# Patient Record
Sex: Male | Born: 1965 | Race: White | Hispanic: No | Marital: Married | Smoking: Never smoker
Health system: Southern US, Community
[De-identification: ages and names within clinical notes are randomized; demographics above are authoritative.]

## PROBLEM LIST (undated history)

## (undated) DIAGNOSIS — L570 Actinic keratosis: Secondary | ICD-10-CM

## (undated) DIAGNOSIS — M8580 Other specified disorders of bone density and structure, unspecified site: Secondary | ICD-10-CM

## (undated) DIAGNOSIS — E119 Type 2 diabetes mellitus without complications: Secondary | ICD-10-CM

## (undated) DIAGNOSIS — I10 Essential (primary) hypertension: Secondary | ICD-10-CM

## (undated) DIAGNOSIS — E785 Hyperlipidemia, unspecified: Secondary | ICD-10-CM

## (undated) HISTORY — DX: Type 2 diabetes mellitus without complications: E11.9

## (undated) HISTORY — DX: Other specified disorders of bone density and structure, unspecified site: M85.80

## (undated) HISTORY — DX: Hyperlipidemia, unspecified: E78.5

## (undated) HISTORY — PX: TONSILECTOMY, ADENOIDECTOMY, BILATERAL MYRINGOTOMY AND TUBES: SHX2538

## (undated) HISTORY — DX: Essential (primary) hypertension: I10

## (undated) HISTORY — DX: Actinic keratosis: L57.0

---

## 2003-07-30 DIAGNOSIS — C4491 Basal cell carcinoma of skin, unspecified: Secondary | ICD-10-CM

## 2003-07-30 HISTORY — DX: Basal cell carcinoma of skin, unspecified: C44.91

## 2009-01-08 ENCOUNTER — Ambulatory Visit: Payer: Self-pay | Admitting: Unknown Physician Specialty

## 2010-01-14 ENCOUNTER — Emergency Department: Payer: Self-pay | Admitting: Emergency Medicine

## 2010-01-28 ENCOUNTER — Ambulatory Visit: Payer: Self-pay | Admitting: Unknown Physician Specialty

## 2010-02-05 ENCOUNTER — Ambulatory Visit: Payer: Self-pay | Admitting: Unknown Physician Specialty

## 2010-03-25 ENCOUNTER — Ambulatory Visit: Payer: Self-pay

## 2010-04-06 ENCOUNTER — Ambulatory Visit: Payer: Self-pay

## 2013-11-01 ENCOUNTER — Ambulatory Visit: Payer: Self-pay | Admitting: Family Medicine

## 2013-11-22 ENCOUNTER — Ambulatory Visit: Payer: Self-pay | Admitting: Family Medicine

## 2014-05-06 DIAGNOSIS — E119 Type 2 diabetes mellitus without complications: Secondary | ICD-10-CM | POA: Insufficient documentation

## 2014-05-06 DIAGNOSIS — G4733 Obstructive sleep apnea (adult) (pediatric): Secondary | ICD-10-CM | POA: Insufficient documentation

## 2014-05-06 DIAGNOSIS — Z9989 Dependence on other enabling machines and devices: Secondary | ICD-10-CM

## 2015-05-07 DIAGNOSIS — K429 Umbilical hernia without obstruction or gangrene: Secondary | ICD-10-CM | POA: Insufficient documentation

## 2015-05-21 ENCOUNTER — Other Ambulatory Visit: Payer: Self-pay | Admitting: Gastroenterology

## 2015-05-21 DIAGNOSIS — R7989 Other specified abnormal findings of blood chemistry: Secondary | ICD-10-CM

## 2015-05-21 DIAGNOSIS — R945 Abnormal results of liver function studies: Principal | ICD-10-CM

## 2015-06-10 ENCOUNTER — Ambulatory Visit
Admission: RE | Admit: 2015-06-10 | Discharge: 2015-06-10 | Disposition: A | Discharge status: Home / Self Care | Payer: BLUE CROSS/BLUE SHIELD | Source: Ambulatory Visit | Attending: Gastroenterology | Admitting: Gastroenterology

## 2015-06-10 DIAGNOSIS — R7989 Other specified abnormal findings of blood chemistry: Secondary | ICD-10-CM | POA: Diagnosis not present

## 2015-06-10 DIAGNOSIS — R945 Abnormal results of liver function studies: Secondary | ICD-10-CM

## 2015-06-10 DIAGNOSIS — K760 Fatty (change of) liver, not elsewhere classified: Secondary | ICD-10-CM | POA: Diagnosis not present

## 2015-06-10 DIAGNOSIS — I77811 Abdominal aortic ectasia: Secondary | ICD-10-CM | POA: Insufficient documentation

## 2015-08-27 DIAGNOSIS — E538 Deficiency of other specified B group vitamins: Secondary | ICD-10-CM | POA: Insufficient documentation

## 2016-11-22 ENCOUNTER — Encounter: Payer: Self-pay | Admitting: Sports Medicine

## 2016-11-22 ENCOUNTER — Ambulatory Visit (INDEPENDENT_AMBULATORY_CARE_PROVIDER_SITE_OTHER): Payer: BLUE CROSS/BLUE SHIELD | Admitting: Sports Medicine

## 2016-11-22 ENCOUNTER — Ambulatory Visit: Payer: Self-pay

## 2016-11-22 ENCOUNTER — Ambulatory Visit (INDEPENDENT_AMBULATORY_CARE_PROVIDER_SITE_OTHER): Payer: BLUE CROSS/BLUE SHIELD

## 2016-11-22 VITALS — BP 120/84 | HR 74 | Ht 72.0 in | Wt 230.0 lb

## 2016-11-22 DIAGNOSIS — F411 Generalized anxiety disorder: Secondary | ICD-10-CM | POA: Insufficient documentation

## 2016-11-22 DIAGNOSIS — S52531A Colles' fracture of right radius, initial encounter for closed fracture: Secondary | ICD-10-CM

## 2016-11-22 DIAGNOSIS — E785 Hyperlipidemia, unspecified: Secondary | ICD-10-CM | POA: Insufficient documentation

## 2016-11-22 DIAGNOSIS — M25531 Pain in right wrist: Secondary | ICD-10-CM

## 2016-11-22 DIAGNOSIS — K219 Gastro-esophageal reflux disease without esophagitis: Secondary | ICD-10-CM | POA: Insufficient documentation

## 2016-11-22 DIAGNOSIS — M8580 Other specified disorders of bone density and structure, unspecified site: Secondary | ICD-10-CM

## 2016-11-22 DIAGNOSIS — S52501A Unspecified fracture of the lower end of right radius, initial encounter for closed fracture: Secondary | ICD-10-CM | POA: Insufficient documentation

## 2016-11-22 DIAGNOSIS — I10 Essential (primary) hypertension: Secondary | ICD-10-CM | POA: Insufficient documentation

## 2016-11-22 DIAGNOSIS — L659 Nonscarring hair loss, unspecified: Secondary | ICD-10-CM | POA: Insufficient documentation

## 2016-11-22 NOTE — Assessment & Plan Note (Signed)
We will need to look into this further at follow-up.  He does have a pathologic fracture may be a candidate for osteoclast inhibitors including Prolia once he is over the acute healing aspect.

## 2016-11-22 NOTE — Progress Notes (Signed)
OFFICE VISIT NOTE Benjamin Little. Benjamin Little, Benjamin Little  Benjamin Little - 51 y.o. male MRN [id number]  Date of birth: Dec 05, 1965  Visit Date: 11/22/2016  PCP: Derinda Late, MD   Referred by: Derinda Late, MD  Burlene Arnt, CMA acting as scribe for Dr. Paulla Fore.  SUBJECTIVE:   Chief Complaint  Patient presents with  . wrist pain s/p fall   HPI: As below and per problem based documentation when appropriate.  Pt presents today with complaint of pain in right wrist.  Pain started Saturday morning after pt fell. He says that when he fell he landed on his palm and bent is wrist back. He has noticed some swelling in the right wrist. Pain is mostly on the radial side of of the wrist. He has noticed decreased flexibility in the wrist and increased tightness. He has pain with flexion, extension, and rotation.   The pain is described as aching soreness and is rated as 5/10.  Worsened with movement Therapies tried include : He wore a carpal tunnel brace yesterday and he felt like his wrist was more stable while wearing the brace but was very sore when he took it off. He has used ice, essential oils and Advil with some relief.   Other associated symptoms include: Pt denies radiating pain into hand, fingers.   Pt denies fever, chills, night sweats.     Review of Systems  Constitutional: Negative for chills and fever.  Respiratory: Negative for shortness of breath and wheezing.   Cardiovascular: Negative for chest pain, palpitations and leg swelling.  Musculoskeletal: Positive for falls.  Neurological: Negative for dizziness, tingling and headaches.  Endo/Heme/Allergies: Does not bruise/bleed easily.    Otherwise per HPI.  HISTORY & PERTINENT PRIOR DATA:  No specialty comments available. He reports that he has never smoked. He has never used smokeless tobacco. No results for input(s): HGBA1C, LABURIC in the last 8760  hours. Medications & Allergies reviewed per EMR Patient Active Problem List   Diagnosis Date Noted  . Esophageal reflux 11/22/2016  . Anxiety state 11/22/2016  . Alopecia 11/22/2016  . Benign essential hypertension 11/22/2016  . Other and unspecified hyperlipidemia 11/22/2016  . Distal radius fracture, right 11/22/2016  . Osteopenia 11/22/2016  . B12 deficiency 08/27/2015  . Umbilical hernia without obstruction and without gangrene 05/07/2015  . OSA on CPAP 05/06/2014  . Diabetes mellitus without complication ([place]) 36/05/2448   Past Medical History:  Diagnosis Date  . Diabetes mellitus without complication ([place])   . Hyperlipidemia   . Hypertension   . Osteopenia    History reviewed. No pertinent family history. Past Surgical History:  Procedure Laterality Date  . TONSILECTOMY, ADENOIDECTOMY, BILATERAL MYRINGOTOMY AND TUBES     Social History   Occupational History  . Not on file.   Social History Main Topics  . Smoking status: Never Smoker  . Smokeless tobacco: Never Used  . Alcohol use No  . Drug use: Unknown  . Sexual activity: Yes    OBJECTIVE:  VS:  HT:6' (182.9 cm)   WT:230 lb (104.3 kg)  BMI:31.3    BP:120/84  HR:74bpm  TEMP: ( )  RESP:94 % EXAM: Findings:  WDWN, NAD, Non-toxic appearing Alert & appropriately interactive Not depressed or anxious appearing No increased work of breathing. Pupils are equal. EOM intact without nystagmus No clubbing or cyanosis of the extremities appreciated No significant rashes/lesions/ulcerations overlying the examined area.  Generalized dry skin with  scattered picking lesions. Radial pulses 2+/4.  No significant generalized UE edema. Sensation intact to light touch in upper extremities.  Right wrist: Overall well aligned.  No visible deformity but some generalized swelling.  No significant ecchymosis.  He does have a small palpable defect in the distal radius and this is focally tender.  No significant wrist  effusion.  Pain with wrist extension, radial deviation and ulnar deviation.  X-rays reviewed show a nondisplaced distal radius fracture consistent with a nondisplaced Colles' fracture with evidence of cortical disruption on the palmar side.  ++++++++++++++++++++++++++++++++++++++++++++++++++++++++++++++++++ LIMITED MSK ULTRASOUND OF right wrist Images were obtained and interpreted by myself, Teresa Coombs, DO  Images have been saved and stored to PACS system. Images obtained on: GE S7 Ultrasound machine  FINDINGS:  Small cortical irregularity within the palmar aspect of the distal radius with large hematoma overall good alignment.  IMPRESSION:  Nondisplaced distal radius fracture     No results found. ASSESSMENT & PLAN:   Problem List Items Addressed This Visit    Distal radius fracture, right    Nondisplaced distal radius fracture with evidence on ultrasound. Overall well aligned.  No significant deformity.  We will place him in a wrist immobilizer and plan follow-up with him in 2 weeks for repeat ultrasound at that time.  Continue with ice elevation and OTC NSAIDs and avoidance of exacerbating activities.      Osteopenia    We will need to look into this further at follow-up.  He does have a pathologic fracture may be a candidate for osteoclast inhibitors including Prolia once he is over the acute healing aspect.       Other Visit Diagnoses    Right wrist pain    -  Primary   Relevant Orders   DG Wrist Complete Right   [place] LIMITED JOINT SPACE STRUCTURES UP RIGHT(NO LINKED CHARGES)      Follow-up: Return in about 2 weeks (around 12/06/2016) for repeat diagnostic ultrasound.   CMA/ATC served as Education administrator during this visit. History, Physical, and Plan performed by medical provider. Documentation and orders reviewed and attested to.      Teresa Coombs, [place] Sports [facility] Physician

## 2016-11-22 NOTE — Assessment & Plan Note (Signed)
Nondisplaced distal radius fracture with evidence on ultrasound. Overall well aligned.  No significant deformity.  We will place him in a wrist immobilizer and plan follow-up with him in 2 weeks for repeat ultrasound at that time.  Continue with ice elevation and OTC NSAIDs and avoidance of exacerbating activities.

## 2016-12-06 ENCOUNTER — Ambulatory Visit: Payer: Self-pay

## 2016-12-06 ENCOUNTER — Encounter: Payer: Self-pay | Admitting: Sports Medicine

## 2016-12-06 ENCOUNTER — Ambulatory Visit (INDEPENDENT_AMBULATORY_CARE_PROVIDER_SITE_OTHER): Payer: BLUE CROSS/BLUE SHIELD | Admitting: Sports Medicine

## 2016-12-06 VITALS — BP 140/100 | HR 73 | Ht 72.0 in | Wt 230.0 lb

## 2016-12-06 DIAGNOSIS — M8580 Other specified disorders of bone density and structure, unspecified site: Secondary | ICD-10-CM

## 2016-12-06 DIAGNOSIS — S52531A Colles' fracture of right radius, initial encounter for closed fracture: Secondary | ICD-10-CM

## 2016-12-06 NOTE — Progress Notes (Signed)
OFFICE VISIT NOTE Benjamin Little. Benjamin Little, [facility] [place]  NHAT HEARNE - 51 y.o. male MRN [id number]  Date of birth: 08-06-1965  Visit Date: 12/06/2016  PCP: Derinda Late, MD   Referred by: Derinda Late, MD  Autumn [address], cma acting as scribe for Dr. Paulla Fore.  SUBJECTIVE:   Chief Complaint  Patient presents with  . Follow-up  . Distal Radius Fracture, Right   HPI: As below and per problem based documentation when appropriate.   Benjamin Little reports increased ROM since last visit but not 100 percent better. He is taking Tumeric 300mg  bid. Wearing the wrist brace daily except in the shower. No ice therapy at this time. He is applying essential oils on wrist.     Review of Systems  Constitutional: Negative for chills, diaphoresis, fever, malaise/fatigue and weight loss.  HENT: Negative.   Eyes: Negative.   Respiratory: Negative.   Cardiovascular: Negative.   Gastrointestinal: Negative.   Genitourinary: Negative.   Musculoskeletal: Positive for joint pain and myalgias.  Skin: Positive for rash. Negative for itching.  Neurological: Negative.  Negative for weakness.  Endo/Heme/Allergies: Negative for environmental allergies and polydipsia. Does not bruise/bleed easily.  Psychiatric/Behavioral: Negative.     Otherwise per HPI.  HISTORY & PERTINENT PRIOR DATA:  No specialty comments available. He reports that he has never smoked. He has never used smokeless tobacco. No results for input(s): HGBA1C, LABURIC in the last 8760 hours. Medications & Allergies reviewed per EMR Patient Active Problem List   Diagnosis Date Noted  . Esophageal reflux 11/22/2016  . Anxiety state 11/22/2016  . Alopecia 11/22/2016  . Benign essential hypertension 11/22/2016  . Other and unspecified hyperlipidemia 11/22/2016  . Distal radius fracture, right 11/22/2016  . Osteopenia 11/22/2016  . B12 deficiency 08/27/2015  . Umbilical hernia  without obstruction and without gangrene 05/07/2015  . OSA on CPAP 05/06/2014  . Diabetes mellitus without complication ([place]) 20/94/7096   Past Medical History:  Diagnosis Date  . Diabetes mellitus without complication ([place])   . Hyperlipidemia   . Hypertension   . Osteopenia    History reviewed. No pertinent family history. Past Surgical History:  Procedure Laterality Date  . TONSILECTOMY, ADENOIDECTOMY, BILATERAL MYRINGOTOMY AND TUBES     Social History   Occupational History  . Not on file.   Social History Main Topics  . Smoking status: Never Smoker  . Smokeless tobacco: Never Used  . Alcohol use No  . Drug use: Unknown  . Sexual activity: Yes    OBJECTIVE:  VS:  HT:6' (182.9 cm)   WT:230 lb (104.3 kg)  BMI:31.3    BP:(!) 140/100  HR:73bpm  TEMP: ( )  RESP:98 % EXAM: Findings:  WDWN, NAD, Non-toxic appearing Alert & appropriately interactive Not depressed or anxious appearing No increased work of breathing. Pupils are equal. EOM intact without nystagmus No clubbing or cyanosis of the extremities appreciated No significant rashes/lesions/ulcerations overlying the examined area. Radial pulses 2+/4.  No significant generalized UE edema. Sensation intact to light touch in upper extremities.  Right Wrist: Overall well aligned.  He is a small amount of hypersensitivity reaction of the skin with overall no significant swelling or bruising.  He does have focal TTP over the DRUJ.  Limited wrist extension.  Grip strength is intact.    ++++++++++++++++++++++++++++++++++++++++++++++++++++++++++++++++++ LIMITED MSK ULTRASOUND OF distal radius Images were obtained and interpreted by myself, Teresa Coombs, DO  Images have been saved and stored to  PACS system. Images obtained on: GE S7 Ultrasound machine  FINDINGS:  Comminuted distal radius fracture with bicortical involvement but no significant displacement.  Hematoma has improved and there is early callus formation  however no solid formation at this time.  IMPRESSION:  Healing distal radius fracture comminuted with no displacement      Dg Wrist Complete Right  Result Date: 11/22/2016 CLINICAL DATA:  Status post fall, with right wrist pain. Initial encounter. EXAM: RIGHT WRIST - COMPLETE 3+ VIEW COMPARISON:  None. FINDINGS: There is suggestion of a nondisplaced mildly comminuted fracture through the distal radius, difficult to fully characterize, with likely intra-articular extension. The carpal rows are intact, and demonstrate normal alignment. The joint spaces are preserved. No significant soft tissue abnormalities are seen. IMPRESSION: Suspect nondisplaced mildly comminuted fracture through the distal radius, with likely intra-articular extension. Electronically Signed   By: Garald Balding M.D.   On: 11/22/2016 17:12   ASSESSMENT & PLAN:   Problem List Items Addressed This Visit    Distal radius fracture, right - Primary    This is stable and healing. Continue with wrist immobilizer but okay to work on gentle range of motion. No activity out of the wrist brace and avoid any significant use of the right wrist.  Patient is using essential oils and we discussed that the merits of this are not supported by evidence but are not entirely contraindicated either.  4 weeks for repeat ultrasound at that time      Relevant Orders   [place] LIMITED JOINT SPACE STRUCTURES UP RIGHT(NO LINKED CHARGES)   Osteopenia      Follow-up: Return in about 4 weeks (around 01/03/2017).   CMA/ATC served as Education administrator during this visit. History, Physical, and Plan performed by medical provider. Documentation and orders reviewed and attested to.      Teresa Coombs, [place] [facility] Physician

## 2016-12-06 NOTE — Assessment & Plan Note (Signed)
This is stable and healing. Continue with wrist immobilizer but okay to work on gentle range of motion. No activity out of the wrist brace and avoid any significant use of the right wrist.  Patient is using essential oils and we discussed that the merits of this are not supported by evidence but are not entirely contraindicated either.  4 weeks for repeat ultrasound at that time

## 2017-01-09 ENCOUNTER — Ambulatory Visit: Payer: Self-pay

## 2017-01-09 ENCOUNTER — Ambulatory Visit (INDEPENDENT_AMBULATORY_CARE_PROVIDER_SITE_OTHER): Payer: BLUE CROSS/BLUE SHIELD | Admitting: Sports Medicine

## 2017-01-09 ENCOUNTER — Encounter: Payer: Self-pay | Admitting: Sports Medicine

## 2017-01-09 DIAGNOSIS — S52531A Colles' fracture of right radius, initial encounter for closed fracture: Secondary | ICD-10-CM

## 2017-01-09 DIAGNOSIS — M25511 Pain in right shoulder: Secondary | ICD-10-CM | POA: Diagnosis not present

## 2017-01-09 DIAGNOSIS — G8929 Other chronic pain: Secondary | ICD-10-CM | POA: Diagnosis not present

## 2017-01-09 NOTE — Progress Notes (Signed)
OFFICE VISIT NOTE Benjamin Little, [place] [facility] [place]  Benjamin Little MRN [id number]  Date of birth: Jan 12, 1966  Visit Date: 01/09/2017  PCP: Benjamin Late, MD   Referred by: Benjamin Late, MD  Autumn [address], cma  acting as scribe for Dr. Paulla Fore.  SUBJECTIVE:   Chief Complaint  Patient presents with  . Closed Fracture Colles' Fracture of RT Radius   HPI: As below and per problem based documentation when appropriate.   Benjamin Little reports improvement in his Right wrist pain. He is wearing brace at bedtime only or when he is physically active. He has not played golf since pain started. Intermittently using essential oils and working on ROM exercises. No pain at rest but doesn't quiet feel its 100%.    Review of Systems  Constitutional: Negative for chills, diaphoresis, fever, malaise/fatigue and weight loss.  HENT: Negative.   Eyes: Negative.   Respiratory: Negative.   Cardiovascular: Negative.   Gastrointestinal: Negative.   Genitourinary: Negative.   Musculoskeletal: Positive for joint pain and myalgias. Negative for back pain, falls and neck pain.  Skin: Negative.   Neurological: Negative.  Negative for weakness.  Endo/Heme/Allergies: Negative for environmental allergies and polydipsia. Does not bruise/bleed easily.  Psychiatric/Behavioral: Negative.     Otherwise per HPI.  HISTORY & PERTINENT PRIOR DATA:  No specialty comments available. He reports that he has never smoked. He has never used smokeless tobacco. No results for input(s): HGBA1C, LABURIC in the last 8760 hours. Medications & Allergies reviewed per EMR Patient Active Problem List   Diagnosis Date Noted  . Esophageal reflux 11/22/2016  . Anxiety state 11/22/2016  . Alopecia 11/22/2016  . Benign essential hypertension 11/22/2016  . Other and unspecified hyperlipidemia 11/22/2016  . Distal radius fracture, right 11/22/2016  .  Osteopenia 11/22/2016  . B12 deficiency 08/27/2015  . Umbilical hernia without obstruction and without gangrene 05/07/2015  . OSA on CPAP 05/06/2014  . Diabetes mellitus without complication ([place]) 28/31/5176   Past Medical History:  Diagnosis Date  . Diabetes mellitus without complication ([place])   . Hyperlipidemia   . Hypertension   . Osteopenia    History reviewed. No pertinent family history. Past Surgical History:  Procedure Laterality Date  . TONSILECTOMY, ADENOIDECTOMY, BILATERAL MYRINGOTOMY AND TUBES     Social History   Occupational History  . Not on file.   Social History Main Topics  . Smoking status: Never Smoker  . Smokeless tobacco: Never Used  . Alcohol use No  . Drug use: Unknown  . Sexual activity: Yes    OBJECTIVE:  VS:  HT:6' (182.9 cm)   WT:230 lb 6.4 oz (104.5 kg)  BMI:31.3    BP:(!) 140/100  HR:73bpm  TEMP: ( )  RESP:98 % EXAM: Findings:  Laboratory overall well aligned.  His skin is intact with only small picking lesions that are chronic for him.  The wrist has only minimal swelling.  He is mildly limited ulnar deviation and radial deviation.  Good grip strength.  Mild pain with wrist extension but this is minimal.  No significant pain with palpation over the DRUJ.  Radial pulses and capillary refill are normal.     [place] Limited Joint Space Structures Up Right(no Linked Charges)  Result Date: 01/09/2017 Gerda Diss, DO     01/11/2017 12:52 AM LIMITED MSK ULTRASOUND OF Right Wrist Images were obtained and interpreted by myself, Teresa Coombs, DO Images have been  saved and stored to PACS system. Images obtained on: GE S7 Ultrasound machine FINDINGS:  Evidence of healing of the distal radius fracture that is mild.  There is no significant articular effusion and there is overall good callus formation. IMPRESSION: 1. Healing distal radius fracture   ASSESSMENT & PLAN:     ICD-10-CM   1. Chronic right shoulder pain M25.511 CANCELED: [place] LIMITED JOINT  SPACE STRUCTURES UP RIGHT(NO LINKED CHARGES)   G89.29   2. Closed Colles' fracture of right radius, initial encounter S52.531A [place] LIMITED JOINT SPACE STRUCTURES UP RIGHT(NO LINKED CHARGES)  ================================================================= Distal radius fracture, right This is healing quite well.  He has only minimal symptoms essentially no pain he has been able to wean out of the brace somewhat earlier than anticipated but the fact he has minimal pain and there is evidence of good callus formation on the ultrasound I am okay with him being out of this on a regular basis.  Slowly increasing activity over the next several weeks will be appropriate and did emphasize the importance of preventing any type of recurrent injury.  If any lack of improvement or persistent symptoms he will plan to follow-up otherwise follow-up as needed. ================================================================= Patient Instructions  You can use the compression sleeve at any time throughout the day but is most important to use while being active as well as for 2 hours post-activity.   It is appropriate to ice following activity with the compression sleeve in place.   ================================================================= No future appointments.  Follow-up: Return if symptoms worsen or fail to improve.   CMA/ATC served as Education administrator during this visit. History, Physical, and Plan performed by medical provider. Documentation and orders reviewed and attested to.      Teresa Coombs, [place] Sports Medicine Physician

## 2017-01-09 NOTE — Patient Instructions (Addendum)
You can use the compression sleeve at any time throughout the day but is most important to use while being active as well as for 2 hours post-activity.   It is appropriate to ice following activity with the compression sleeve in place.   

## 2017-01-11 NOTE — Assessment & Plan Note (Signed)
This is healing quite well.  He has only minimal symptoms essentially no pain he has been able to wean out of the brace somewhat earlier than anticipated but the fact he has minimal pain and there is evidence of good callus formation on the ultrasound I am okay with him being out of this on a regular basis.  Slowly increasing activity over the next several weeks will be appropriate and did emphasize the importance of preventing any type of recurrent injury.  If any lack of improvement or persistent symptoms he will plan to follow-up otherwise follow-up as needed.

## 2017-01-11 NOTE — Procedures (Signed)
LIMITED MSK ULTRASOUND OF Right Wrist Images were obtained and interpreted by myself, Teresa Coombs, DO  Images have been saved and stored to PACS system. Images obtained on: GE S7 Ultrasound machine  FINDINGS:   Evidence of healing of the distal radius fracture that is mild.  There is no significant articular effusion and there is overall good callus formation.  IMPRESSION:  1. Healing distal radius fracture

## 2017-03-10 ENCOUNTER — Telehealth: Payer: Self-pay | Admitting: Sports Medicine

## 2017-03-10 NOTE — Telephone Encounter (Signed)
Patient called in reference to receipt issue for [employer]. Patient stated the receipt does not match for [employer] pay account. Patient stated the amount was $36.46 but receipt  needed is for $36.47. Please call patient and advise. OK to leave message.

## 2017-03-10 NOTE — Telephone Encounter (Signed)
Spoke with patient and provided phone number for [employer] rep Clarise Cruz. Pt will contact her regarding billing issue.

## 2017-04-13 DIAGNOSIS — C4492 Squamous cell carcinoma of skin, unspecified: Secondary | ICD-10-CM

## 2017-04-13 HISTORY — DX: Squamous cell carcinoma of skin, unspecified: C44.92

## 2017-12-08 ENCOUNTER — Encounter: Payer: Self-pay | Admitting: Cardiovascular Disease

## 2017-12-08 ENCOUNTER — Encounter

## 2017-12-08 ENCOUNTER — Ambulatory Visit: Payer: BLUE CROSS/BLUE SHIELD | Admitting: Cardiovascular Disease

## 2017-12-08 VITALS — BP 148/84 | HR 73 | Ht 72.0 in | Wt 232.0 lb

## 2017-12-08 DIAGNOSIS — I10 Essential (primary) hypertension: Secondary | ICD-10-CM

## 2017-12-08 DIAGNOSIS — R0789 Other chest pain: Secondary | ICD-10-CM

## 2017-12-08 DIAGNOSIS — E785 Hyperlipidemia, unspecified: Secondary | ICD-10-CM

## 2017-12-08 MED ORDER — METOPROLOL TARTRATE 50 MG PO TABS: 50.0000 mg | ORAL_TABLET | ORAL | 0 refills | Status: DC

## 2017-12-08 NOTE — Patient Instructions (Addendum)
Please arrive at the [facility] main entrance of [facility] at xx:xx AM (30-45 minutes prior to test start time)  [facility] [place],  [address] [phone]  Proceed to the [facility] Radiology Department (First Floor).  Please follow these instructions carefully (unless otherwise directed):  Hold all erectile dysfunction medications at least 48 hours prior to test.  On the Night Before the Test: . Drink plenty of water. . Do not consume any caffeinated/decaffeinated beverages or chocolate 12 hours prior to your test. . Do not take any antihistamines 12 hours prior to your test. . If you take Metformin do not take 24 hours prior to test.  On the Day of the Test: . Drink plenty of water. Do not drink any water within one hour of the test. . Do not eat any food 4 hours prior to the test. . You may take your regular medications prior to the test. . IF NOT ON A BETA BLOCKER - Take 50 mg of lopressor (metoprolol) one hour before the test. . HOLD Furosemide morning of the test.  After the Test: . Drink plenty of water. . After receiving IV contrast, you may experience a mild flushed feeling. This is normal. . On occasion, you may experience a mild rash up to 24 hours after the test. This is not dangerous. If this occurs, you can take Benadryl 25 mg and increase your fluid intake. . If you experience trouble breathing, this can be serious. If it is severe call 911 IMMEDIATELY. If it is mild, please call our office. . If you take any of these medications: Glipizide/Metformin, Avandament, Glucavance, please do not take 48 hours after completing test.   Your physician recommends that you schedule a follow-up appointment in: 3 months with Dr. Fletcher Anon.   It was a pleasure seeing you today here in the office. Please do not hesitate to give [place] a call back if you have any further questions. [place], BSN

## 2017-12-08 NOTE — Progress Notes (Signed)
Cardiology Office Note   Date:  12/08/2017   ID:  Benjamin Little, DOB [birth year], MRN [id number]  PCP:  Derinda Late, MD  Cardiologist:   Kathlyn Sacramento, MD   Chief Complaint  Patient presents with  . New Patient (Initial Visit)    Self referral for Chest Pressure. Meds reviewed verbally with patient.       History of Present Illness: Benjamin Little is a 52 y.o. male who is self-referred for evaluation of chest pain.  He has no prior cardiac history and no previous cardiac evaluation.  He has multiple chronic medical problems that include type 2 diabetes, hypertension, hyperlipidemia, sleep apnea on CPAP, previous pancreatitis, GERD and obesity.  He is a lifelong non-smoker.  Family history is remarkable for coronary artery disease.  His father had CABG in his 30s.  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 Over the last month, he has experienced intermittent episodes of substernal chest tightness mostly at rest but occasionally with activities.  This has been associated with shortness of breath.  He does not exercise on a regular basis.  He also had increased heartburn.  He was prescribed Zantac in addition to his PPI and has noticed some improvement but not complete resolution. No orthopnea, PND or leg edema.    Past Medical History:  Diagnosis Date  . Diabetes mellitus without complication ([place])   . Hyperlipidemia   . Hypertension   . Osteopenia     Past Surgical History:  Procedure Laterality Date  . TONSILECTOMY, ADENOIDECTOMY, BILATERAL MYRINGOTOMY AND TUBES       Current Outpatient Medications  Medication Sig Dispense Refill  . dexlansoprazole (DEXILANT) 60 MG capsule TAKE ONE CAPSULE BY MOUTH TWICE A DAY    . glipiZIDE (GLUCOTROL XL) 2.5 MG 24 hr tablet     . lisinopril-hydrochlorothiazide (PRINZIDE,ZESTORETIC) 20-12.5 MG tablet Take 1 tablet by mouth daily.   3  . metFORMIN (GLUCOPHAGE) 500 MG tablet TAKE 2 TABLETS (1,000 MG TOTAL) BY MOUTH 2 (TWO) TIMES DAILY  WITH MEALS.  3  . ONETOUCH DELICA LANCETS 47W MISC     . ONETOUCH VERIO test strip     . simvastatin (ZOCOR) 20 MG tablet TAKE 1 TABLET (20 MG TOTAL) BY MOUTH NIGHTLY.  3  . TURMERIC PO Take 300 mg by mouth.      No current facility-administered medications for this visit.     Allergies:   Patient has no known allergies.    Social History:  The patient  reports that he has never smoked. He has never used smokeless tobacco. He reports that he does not drink alcohol.   Family History:  The patient's family history is remarkable for coronary artery disease.  His father had CABG in his 26s as outlined above.   ROS:  Please see the history of present illness.   Otherwise, review of systems are positive for none.   All other systems are reviewed and negative.    PHYSICAL EXAM: VS:  BP (!) 148/84 (BP Location: Right Arm, Patient Position: Sitting, Cuff Size: Normal)   Pulse 73   Ht 6' (1.829 m)   Wt 232 lb (105.2 kg)   BMI 31.46 kg/m  , BMI Body mass index is 31.46 kg/m. GEN: Well nourished, well developed, in no acute distress  HEENT: normal  Neck: no JVD, carotid bruits, or masses Cardiac: RRR; no murmurs, rubs, or gallops,no edema  Respiratory:  clear to auscultation bilaterally, normal work of breathing GI:  soft, nontender, nondistended, + BS MS: no deformity or atrophy  Skin: warm and dry, no rash Neuro:  Strength and sensation are intact Psych: euthymic mood, full affect Radial and distal pulses are normal.  No abdominal bruit.   EKG:  EKG is ordered today. The ekg ordered today demonstrates normal sinus rhythm with no significant ST or T wave changes   Recent Labs: No results found for requested labs within last 8760 hours.    Lipid Panel No results found for: CHOL, TRIG, HDL, CHOLHDL, VLDL, LDLCALC, LDLDIRECT    Wt Readings from Last 3 Encounters:  12/08/17 232 lb (105.2 kg)  01/09/17 230 lb 6.4 oz (104.5 kg)  12/06/16 230 lb (104.3 kg)       PAD Screen  12/08/2017  Previous PAD dx? No  Previous surgical procedure? No  Pain with walking? No  Feet/toe relief with dangling? No  Painful, non-healing ulcers? No  Extremities discolored? No      ASSESSMENT AND PLAN:  1.  Atypical chest pain and exertional dyspnea: His symptoms could be due to GERD.  However, he has multiple risk factors for coronary artery disease including type 2 diabetes.  I recommend evaluation with a CTA of the coronary arteries with FFR.  He is not allergic to contrast.  Further recommendations to follow after this.  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  2.  Essential hypertension: Blood pressure is elevated today but he appears to be very anxious.  Continue to monitor and consider adjusting his blood pressure medications if needed.  3.  Hyperlipidemia: Currently on simvastatin.  I recommend a target LDL of less than 70 given the presence of diabetes.    Disposition:   FU with me in 3 months  Signed,  Kathlyn Sacramento, MD  12/08/2017 9:30 AM    [place]

## 2017-12-22 ENCOUNTER — Telehealth: Payer: Self-pay | Admitting: *Deleted

## 2017-12-22 DIAGNOSIS — I10 Essential (primary) hypertension: Secondary | ICD-10-CM

## 2017-12-22 DIAGNOSIS — Z01818 Encounter for other preprocedural examination: Secondary | ICD-10-CM

## 2017-12-22 NOTE — Telephone Encounter (Signed)
Left a message for the patient to call back. He will need to have a BMET lab drawn prior to his coronary ct scheduled for 01/09/18. Orders have been placed.

## 2017-12-27 NOTE — Telephone Encounter (Signed)
Left a message to call back.

## 2017-12-28 NOTE — Telephone Encounter (Signed)
Call placed to the patient. He stated that he had labs scheduled at his PCP. He will call that office and see if he can get them drawn tomorrow. He will call back with an update. It was stressed to the patient that he needs a BMET prior to his cardiac ct appointment on 01/09/18. Results will be in Care Everywhere.

## 2017-12-28 NOTE — Telephone Encounter (Signed)
Patient returning call.

## 2018-01-03 NOTE — Telephone Encounter (Signed)
Spoke with patient and he reports that they were done during pre-admission and was faxed over to [place] yesterday. Advised that I do not see those at this time but that I would watch for them and be in touch. He was appreciative for the call and follow up. Will call back tomorrow 01/04/18 if not received.

## 2018-01-03 NOTE — Telephone Encounter (Signed)
Patient calling to let us know he had pre procedure labs done at [facility] and these were to be faxed to our office.

## 2018-01-05 NOTE — Telephone Encounter (Signed)
Labs from 12/29/17 are posted in Care Everywhere

## 2018-01-08 ENCOUNTER — Telehealth: Payer: Self-pay | Admitting: Cardiovascular Disease

## 2018-01-08 NOTE — Telephone Encounter (Signed)
Patient calling stating he has a cardiac CT tomorrow He's calling stating on his instructions it says he's not to take Furosemide  He is not taking that he states, wanted to make sure about what he is taking and not to take prior to exam  Would like a call back about this

## 2018-01-08 NOTE — Telephone Encounter (Signed)
S/w patient. Patient is not taking Furosemide. His AVS has the generic pre-procedural instructions for the Cardiac CT. Patient verbalized understanding to not take metformin or glipizide today or for 48 hours after the procedure. He also verbalized understanding of the other pre procedural instructions. He was appreciative.

## 2018-01-09 ENCOUNTER — Ambulatory Visit (HOSPITAL_COMMUNITY): Admission: RE | Admit: 2018-01-09 | Payer: BLUE CROSS/BLUE SHIELD | Source: Ambulatory Visit

## 2018-01-09 ENCOUNTER — Ambulatory Visit (HOSPITAL_COMMUNITY)
Admission: RE | Admit: 2018-01-09 | Discharge: 2018-01-09 | Disposition: A | Discharge status: Home / Self Care | Payer: BLUE CROSS/BLUE SHIELD | Source: Ambulatory Visit | Attending: Cardiovascular Disease | Admitting: Cardiovascular Disease

## 2018-01-09 DIAGNOSIS — R079 Chest pain, unspecified: Secondary | ICD-10-CM | POA: Diagnosis not present

## 2018-01-09 DIAGNOSIS — R0789 Other chest pain: Secondary | ICD-10-CM | POA: Diagnosis not present

## 2018-01-09 MED ORDER — NITROGLYCERIN 0.4 MG SL SUBL
SUBLINGUAL_TABLET | SUBLINGUAL | Status: AC
  Filled 2018-01-09: qty 2

## 2018-01-09 MED ORDER — NITROGLYCERIN 0.4 MG SL SUBL
0.8000 mg | SUBLINGUAL_TABLET | Freq: Once | SUBLINGUAL | Status: AC
  Administered 2018-01-09: 0.8 mg via SUBLINGUAL
  Filled 2018-01-09: qty 25

## 2018-01-09 MED ORDER — IOPAMIDOL (ISOVUE-370) INJECTION 76%
100.0000 mL | Freq: Once | INTRAVENOUS | Status: AC | PRN
  Administered 2018-01-09: 100 mL via INTRAVENOUS

## 2018-01-11 ENCOUNTER — Telehealth: Payer: Self-pay | Admitting: Cardiovascular Disease

## 2018-01-11 NOTE — Telephone Encounter (Signed)
Patient made aware of results and verbalized understanding.  

## 2018-01-11 NOTE — Telephone Encounter (Signed)
Patient returning call for results Please call cell when calling back

## 2018-01-11 NOTE — Telephone Encounter (Signed)
Left a message to call back  Notes recorded by Wellington Hampshire, MD on 01/10/2018 at 12:44 PM EDT Inform patient that CTA showed normal coronary arteries which is great news.

## 2018-02-24 IMAGING — DX DG WRIST COMPLETE 3+V*R*
4 series · 4 of 4 positions shown · non-contrast
Comparison: None.

CLINICAL DATA: Status post fall, with right wrist pain. Initial
encounter.

EXAM:
RIGHT WRIST - COMPLETE 3+ VIEW

[wrist pa]
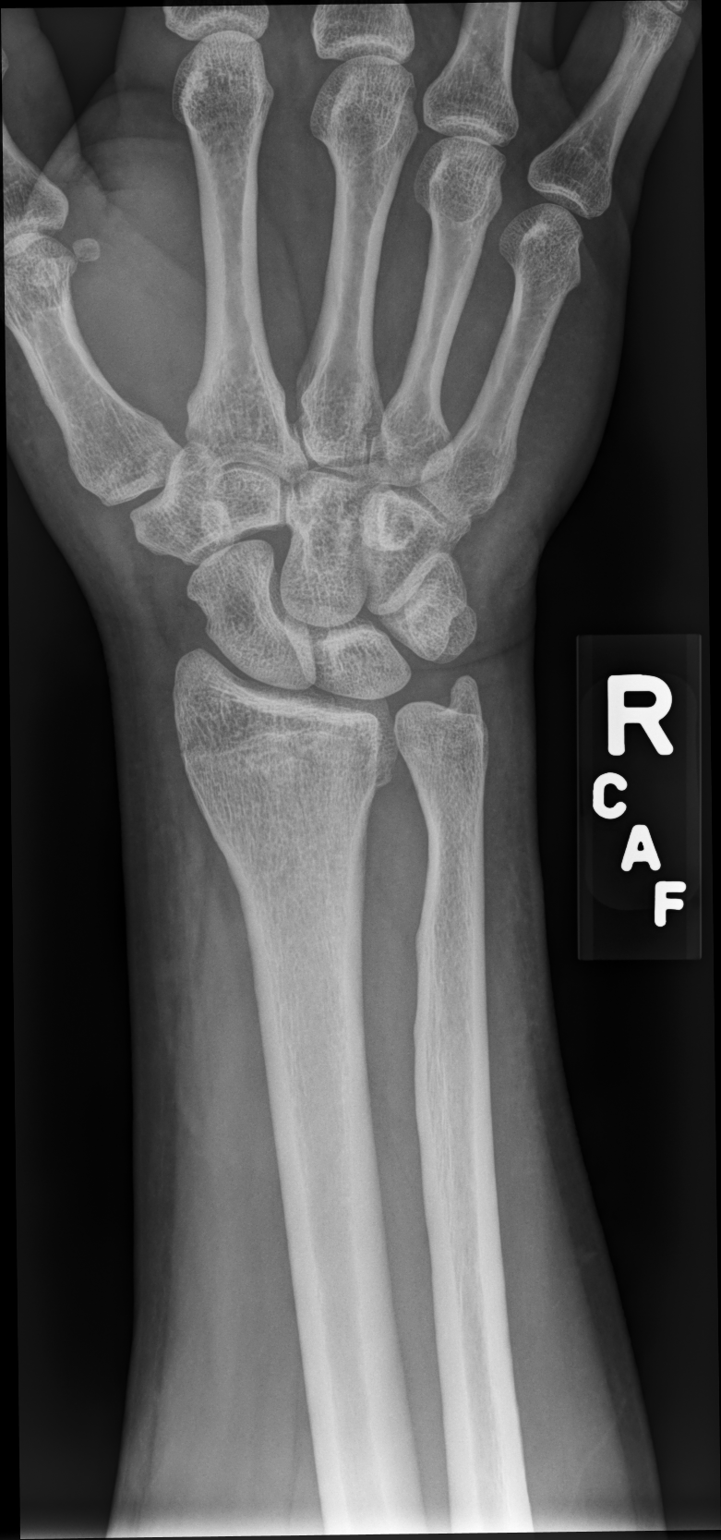

[wrist oblique]
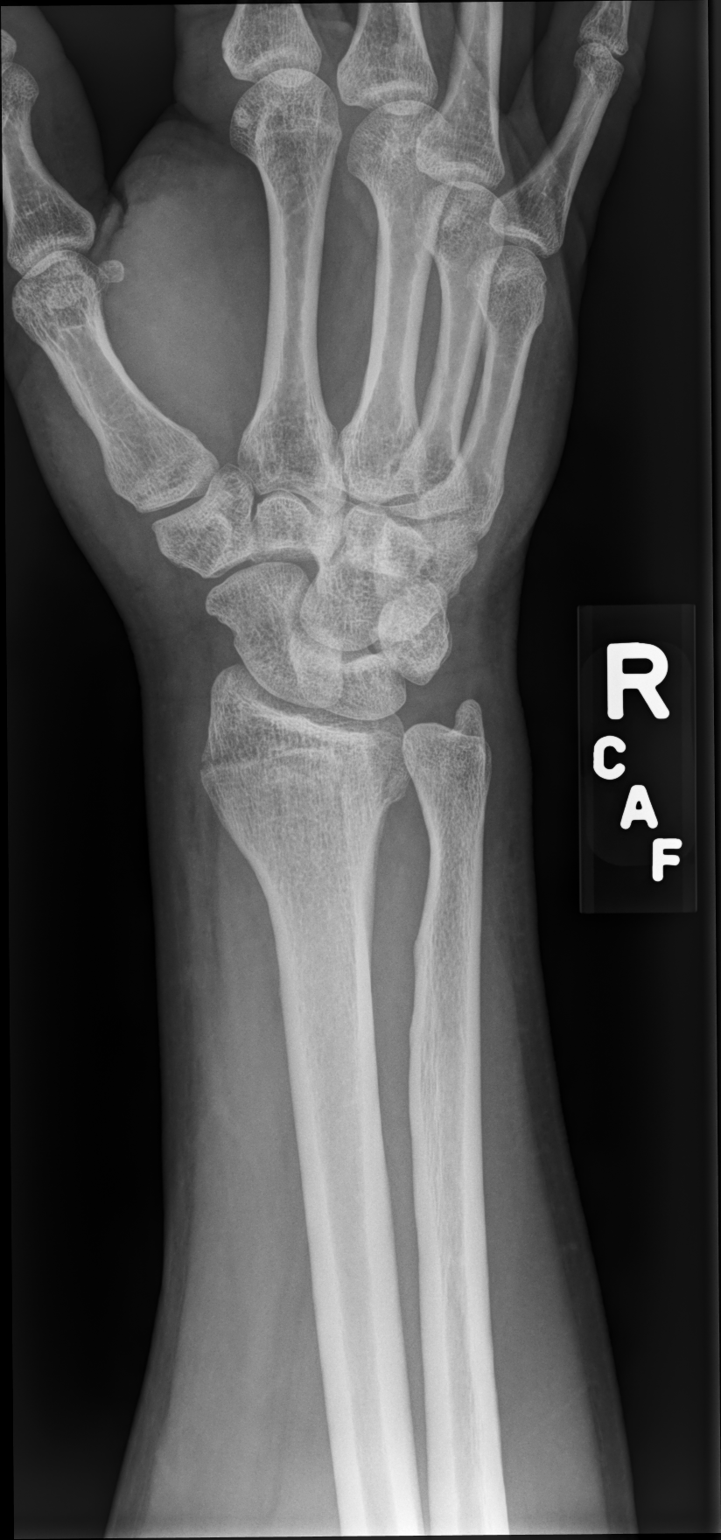

[wrist lat]
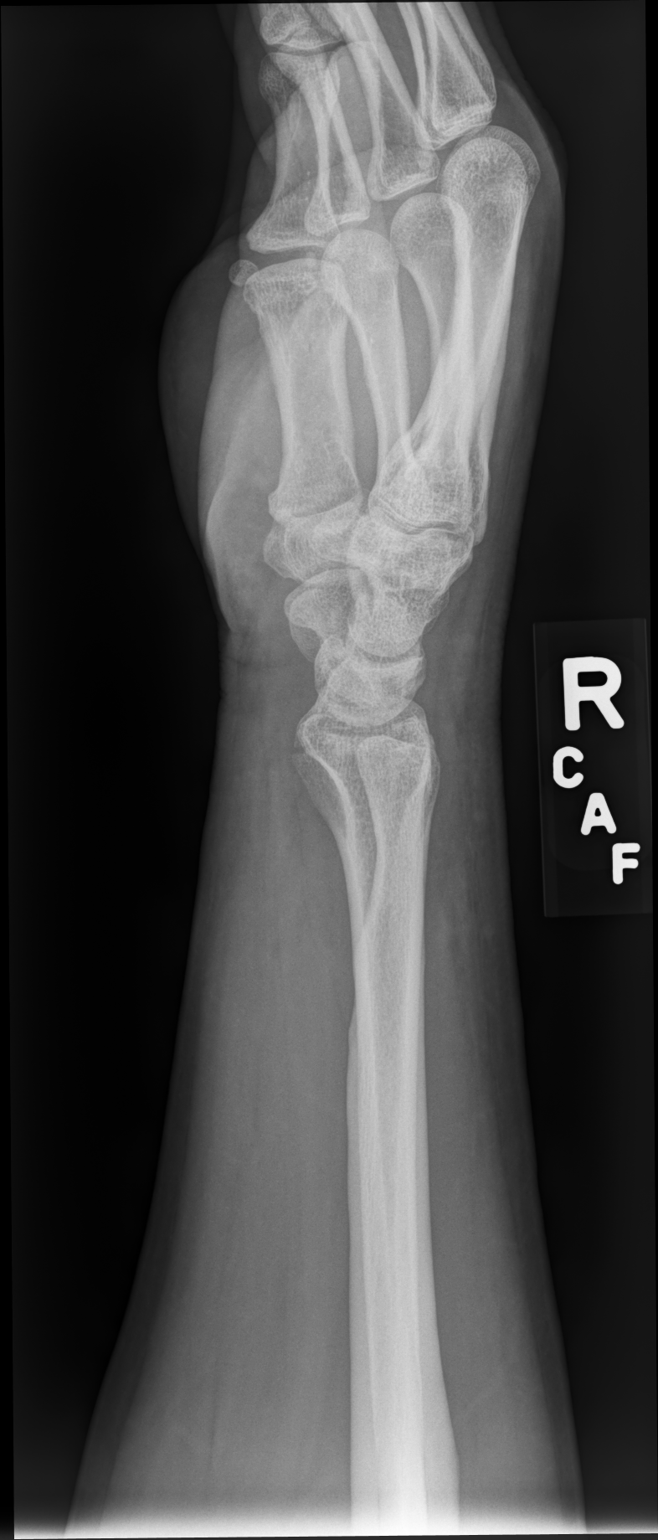

[navicular pa]
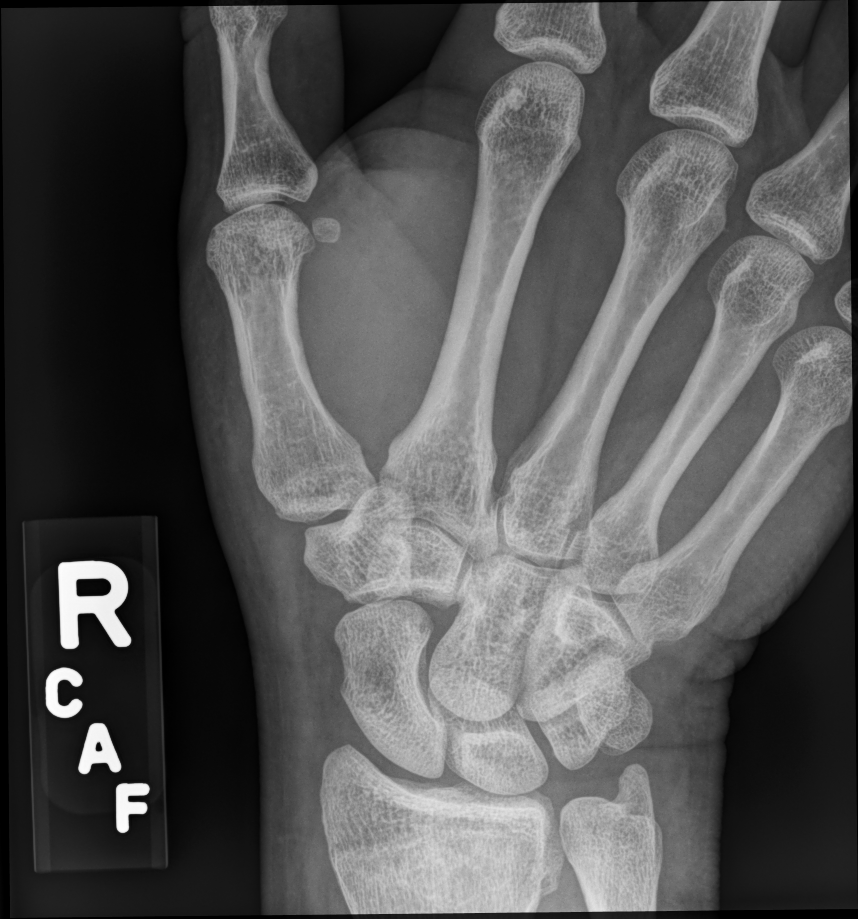

[4 of 4 positions shown; findings below may reference images not displayed]

FINDINGS: There is suggestion of a nondisplaced mildly comminuted fracture
through the distal radius, difficult to fully characterize, with
likely intra-articular extension.

The carpal rows are intact, and demonstrate normal alignment. The
joint spaces are preserved.

No significant soft tissue abnormalities are seen.
IMPRESSION: Suspect nondisplaced mildly comminuted fracture through the distal
radius, with likely intra-articular extension.

## 2018-03-23 ENCOUNTER — Encounter: Payer: Self-pay | Admitting: Cardiovascular Disease

## 2018-03-23 ENCOUNTER — Ambulatory Visit: Payer: BLUE CROSS/BLUE SHIELD | Admitting: Cardiovascular Disease

## 2018-03-23 VITALS — BP 120/80 | HR 66 | Ht 72.0 in | Wt 222.0 lb

## 2018-03-23 DIAGNOSIS — R0789 Other chest pain: Secondary | ICD-10-CM

## 2018-03-23 DIAGNOSIS — I10 Essential (primary) hypertension: Secondary | ICD-10-CM | POA: Diagnosis not present

## 2018-03-23 DIAGNOSIS — I77811 Abdominal aortic ectasia: Secondary | ICD-10-CM

## 2018-03-23 DIAGNOSIS — E785 Hyperlipidemia, unspecified: Secondary | ICD-10-CM | POA: Diagnosis not present

## 2018-03-23 NOTE — Progress Notes (Signed)
Cardiology Office Note   Date:  03/23/2018   ID:  Benjamin Little, DOB 1966/06/02, MRN [id number]  PCP:  Derinda Late, MD  Cardiologist:   Kathlyn Sacramento, MD   Chief Complaint  Patient presents with  . other    3 month follow up. Meds reviewed by the pt. verbally. "doing well."       History of Present Illness: Benjamin Little is a 52 y.o. male who is here today for follow-up visit regarding chest pain.    He has no prior cardiac history and no previous cardiac evaluation.  He has multiple chronic medical problems that include type 2 diabetes, hypertension, hyperlipidemia, sleep apnea on CPAP, previous pancreatitis, GERD and obesity.  He is a lifelong non-smoker.  Family history is remarkable for coronary artery disease.  His father had CABG in his 24s.  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 He was seen recently for atypical chest pain with some of his symptoms were exertional.  I proceeded with CT angiography which showed normal coronary arteries and calcium score of 0.  His symptoms improved with ranitidine.    Past Medical History:  Diagnosis Date  . Diabetes mellitus without complication ([place])   . Hyperlipidemia   . Hypertension   . Osteopenia     Past Surgical History:  Procedure Laterality Date  . TONSILECTOMY, ADENOIDECTOMY, BILATERAL MYRINGOTOMY AND TUBES       Current Outpatient Medications  Medication Sig Dispense Refill  . dexlansoprazole (DEXILANT) 60 MG capsule Take 60 mg by mouth daily.     Marland Kitchen glipiZIDE (GLUCOTROL XL) 2.5 MG 24 hr tablet Take 2.5 mg by mouth daily with breakfast.     . lisinopril-hydrochlorothiazide (PRINZIDE,ZESTORETIC) 20-12.5 MG tablet Take 2 tablets by mouth daily.   3  . metFORMIN (GLUCOPHAGE) 500 MG tablet TAKE 2 TABLETS (1,000 MG TOTAL) BY MOUTH 2 (TWO) TIMES DAILY WITH MEALS.  3  . ONETOUCH DELICA LANCETS 78G MISC     . ONETOUCH VERIO test strip     . ranitidine (ZANTAC) 150 MG capsule Take 150 mg by mouth 2 (two) times  daily.    . simvastatin (ZOCOR) 20 MG tablet TAKE 1 TABLET (20 MG TOTAL) BY MOUTH NIGHTLY.  3  . TURMERIC PO Take 300 mg by mouth.      No current facility-administered medications for this visit.     Allergies:   Patient has no known allergies.    Social History:  The patient  reports that he has never smoked. He has never used smokeless tobacco. He reports that he does not drink alcohol.   Family History:  The patient's family history is remarkable for coronary artery disease.  His father had CABG in his 73s as outlined above.   ROS:  Please see the history of present illness.   Otherwise, review of systems are positive for none.   All other systems are reviewed and negative.    PHYSICAL EXAM: VS:  BP 120/80 (BP Location: Left Arm, Patient Position: Sitting, Cuff Size: Normal)   Pulse 66   Ht 6' (1.829 m)   Wt 222 lb (100.7 kg)   BMI 30.11 kg/m  , BMI Body mass index is 30.11 kg/m. GEN: Well nourished, well developed, in no acute distress  HEENT: normal  Neck: no JVD, carotid bruits, or masses Cardiac: RRR; no murmurs, rubs, or gallops,no edema  Respiratory:  clear to auscultation bilaterally, normal work of breathing GI: soft, nontender, nondistended, + BS  MS: no deformity or atrophy  Skin: warm and dry, no rash Neuro:  Strength and sensation are intact Psych: euthymic mood, full affect    EKG:  EKG is ordered today. The ekg ordered today demonstrates normal sinus rhythm with no significant ST or T wave changes   Recent Labs: No results found for requested labs within last 8760 hours.    Lipid Panel No results found for: CHOL, TRIG, HDL, CHOLHDL, VLDL, LDLCALC, LDLDIRECT    Wt Readings from Last 3 Encounters:  03/23/18 222 lb (100.7 kg)  12/08/17 232 lb (105.2 kg)  01/09/17 230 lb 6.4 oz (104.5 kg)       PAD Screen 12/08/2017  Previous PAD dx? No  Previous surgical procedure? No  Pain with walking? No  Feet/toe relief with dangling? No  Painful,  non-healing ulcers? No  Extremities discolored? No      ASSESSMENT AND PLAN:  1.  Atypical chest pain: CT coronary angiogram showed normal coronary arteries and 0 calcium score.  His symptoms are likely related to GERD.  Significant improvement with the addition of ranitidine but if his symptoms recur, I recommend GI consultation.  2.  Essential hypertension: Blood pressure is well controlled  3.  Hyperlipidemia: Currently on simvastatin.  I recommend a target LDL of less than 70 given the presence of diabetes.  4.  Mild abdominal aortic ectasia: His abdominal aorta measured 2.6 cm in 2017.  Recommend repeat study in 3 to 5 years.    Disposition:   FU with me as needed  Signed,  Kathlyn Sacramento, MD  03/23/2018 9:06 AM    [place]

## 2018-03-23 NOTE — Patient Instructions (Signed)
Medication Instructions:  No changes  If you need a refill on your cardiac medications before your next appointment, please call your pharmacy.   Lab work: None ordered  Testing/Procedures: None ordered  Follow-Up: Your physician recommends that you follow up as needed with Dr. Fletcher Anon.

## 2019-04-13 IMAGING — CT CT HEART MORP W/ CTA COR W/ SCORE W/ CA W/CM &/OR W/O CM
4 of 7 series · 8 of 20 positions shown, 9 images · non-contrast
Comparison: None.

CLINICAL DATA: Chest pain

EXAM:
Cardiac CTA
MEDICATIONS:
Sub lingual nitro. 4mg x 2 and lopressor 5mg IV
TECHNIQUE: The patient was scanned on a Siemens [REDACTED]ice scanner. Gantry
rotation speed was 250 msecs. Collimation was 0.8 mm. A 100 kV
prospective scan was triggered in the ascending thoracic aorta at
35-75% of the R-R interval. Average HR during the scan was 60 bpm.
The 3D data set was interpreted on a dedicated work station using
MPR, MIP and VRT modes. A total of 80cc of contrast was used.

[Series 6: best diast 73 % · axial · 0.43mm/px · z∈[+994,+1049]mm · 2 of 412 slices shown, 3 images]
[im 138/412  vessel]
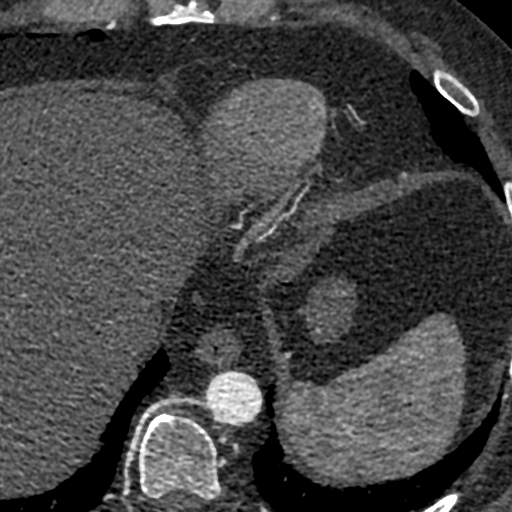
[im 138/412  lung]
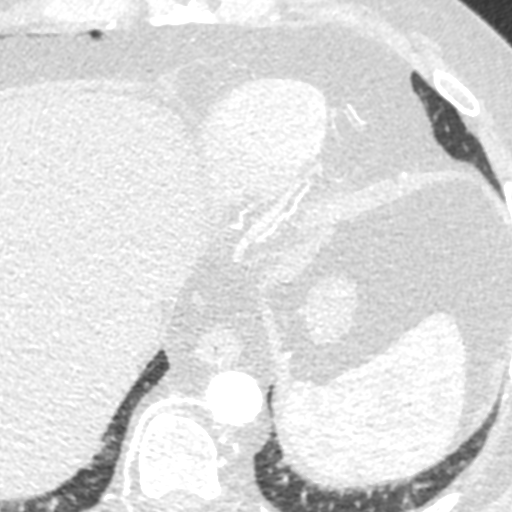
[im 275/412  vessel]
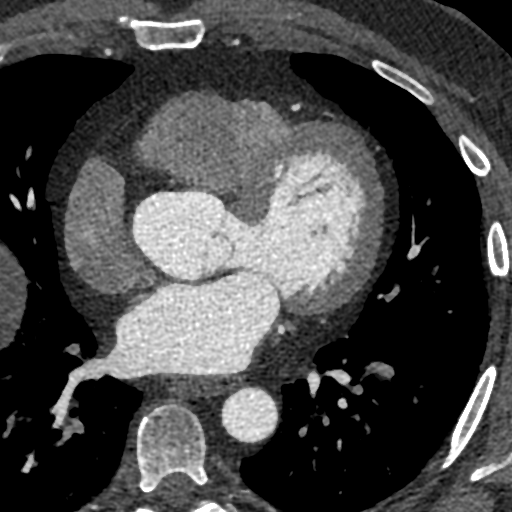

[Series 7: best syst 40 % · axial · 0.43mm/px · z∈[+994,+1049]mm · 2 of 412 slices shown]
[im 138/412  vessel]
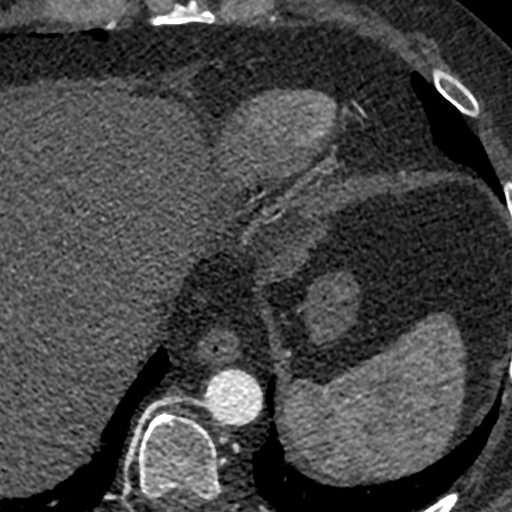
[im 275/412  vessel]
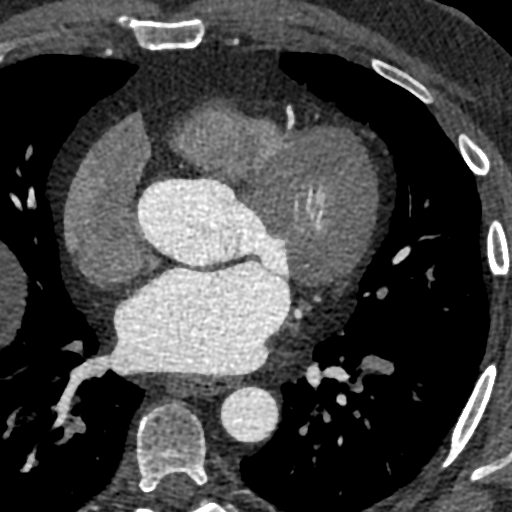

[Series 8: ts diast sharp 73 % · axial · 0.43mm/px · z∈[+994,+1049]mm · 2 of 412 slices shown]
[im 138/412  lung]
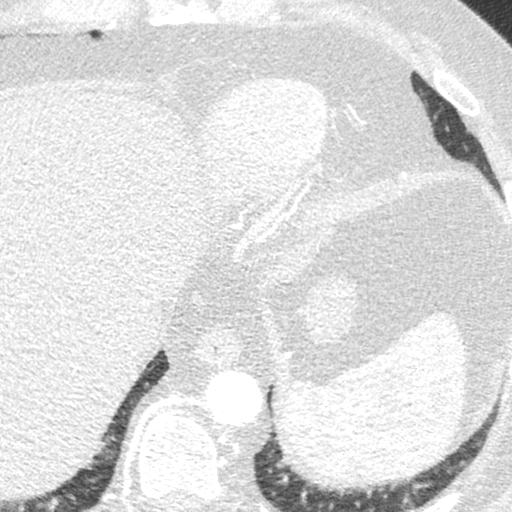
[im 275/412  lung]
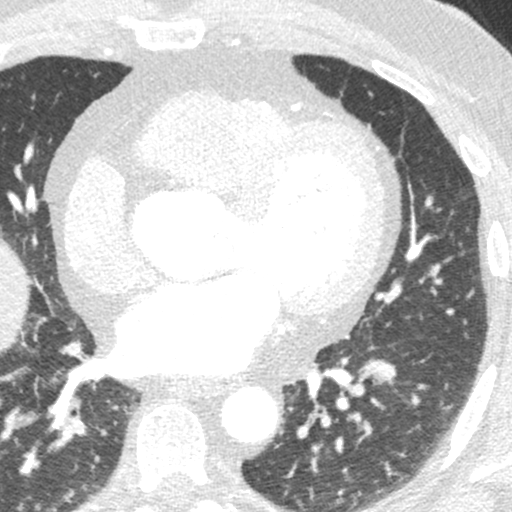

[Series 9: ts syst sharp 40 % · axial · 0.43mm/px · z∈[+994,+1049]mm · 2 of 412 slices shown]
[im 138/412  lung]
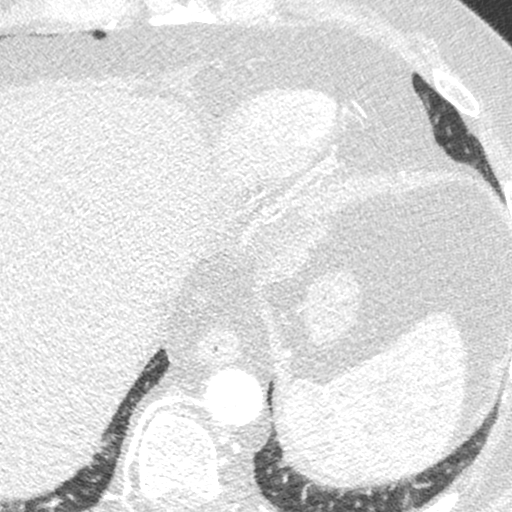
[im 275/412  lung]
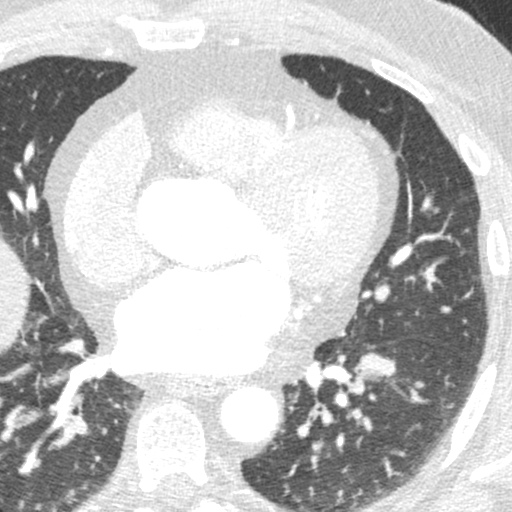

[8 of 20 positions shown; findings below may reference images not displayed]

FINDINGS: Non-cardiac: See separate report from [REDACTED].

Calcium Score: 0 Agatston units

Coronary Arteries: Right dominant with no anomalies

LM: No plaque or stenosis.

LAD system: No plaque or stenosis.

Circumflex system: No plaque or stenosis.

RCA system: No plaque or stenosis.
IMPRESSION: 1. Coronary artery calcium score 0 Agatston units, suggesting low
risk for future cardiac events.

2.  No significant coronary disease noted.

Hien Strauch

EXAM:
OVER-READ INTERPRETATION  CT CHEST

The following report is an over-read performed by radiologist Dr.
Wei-Yi Magayo [REDACTED] on 01/09/2018. This
over-read does not include interpretation of cardiac or coronary
anatomy or pathology. The coronary calcium score/coronary CTA
interpretation by the cardiologist is attached.
FINDINGS: Limited view of the lung parenchyma demonstrates no suspicious
nodularity. Airways are normal.

Limited view of the mediastinum demonstrates no adenopathy.
Esophagus normal.

Limited view of the upper abdomen unremarkable.

Limited view of the skeleton and chest wall is unremarkable.
IMPRESSION: No significant extracardiac findings.

## 2021-11-11 ENCOUNTER — Telehealth: Payer: Self-pay

## 2021-11-11 NOTE — Telephone Encounter (Signed)
Patient called to schedule his twice yearly follow up. First patient spoke with Laurann Montana and argued he has been here since last visit and she was wrong. Laurann Montana explained to patient he was last seen with [facility] BUT in our [place] system in 2020. Patient states that was wrong information and wanted to speak to someone else. Patient was transferred to [facility]. Lattie Haw spoke with patient and advised we would call him back.  Hollie and myself have investigated this patient's chart and the last time he was seen was in 2020, office note and biopsy (which led to the referral of Dr. Lacinda Axon).   Patient has not been in our office for the year of 2021, 2022 or 2023.  Called patient and left voicemail to return our call.

## 2022-01-12 ENCOUNTER — Ambulatory Visit: Payer: BC Managed Care – PPO | Admitting: Dermatology

## 2022-01-12 ENCOUNTER — Encounter: Payer: Self-pay | Admitting: Dermatology

## 2022-01-12 DIAGNOSIS — C44319 Basal cell carcinoma of skin of other parts of face: Secondary | ICD-10-CM | POA: Diagnosis not present

## 2022-01-12 DIAGNOSIS — Z1283 Encounter for screening for malignant neoplasm of skin: Secondary | ICD-10-CM

## 2022-01-12 DIAGNOSIS — L578 Other skin changes due to chronic exposure to nonionizing radiation: Secondary | ICD-10-CM | POA: Diagnosis not present

## 2022-01-12 DIAGNOSIS — L820 Inflamed seborrheic keratosis: Secondary | ICD-10-CM

## 2022-01-12 DIAGNOSIS — D229 Melanocytic nevi, unspecified: Secondary | ICD-10-CM

## 2022-01-12 DIAGNOSIS — L570 Actinic keratosis: Secondary | ICD-10-CM

## 2022-01-12 DIAGNOSIS — Z85828 Personal history of other malignant neoplasm of skin: Secondary | ICD-10-CM

## 2022-01-12 DIAGNOSIS — Z86007 Personal history of in-situ neoplasm of skin: Secondary | ICD-10-CM | POA: Diagnosis not present

## 2022-01-12 DIAGNOSIS — L817 Pigmented purpuric dermatosis: Secondary | ICD-10-CM

## 2022-01-12 DIAGNOSIS — L814 Other melanin hyperpigmentation: Secondary | ICD-10-CM

## 2022-01-12 DIAGNOSIS — C44612 Basal cell carcinoma of skin of right upper limb, including shoulder: Secondary | ICD-10-CM | POA: Diagnosis not present

## 2022-01-12 DIAGNOSIS — D1800 Hemangioma unspecified site: Secondary | ICD-10-CM

## 2022-01-12 DIAGNOSIS — L821 Other seborrheic keratosis: Secondary | ICD-10-CM

## 2022-01-12 DIAGNOSIS — D492 Neoplasm of unspecified behavior of bone, soft tissue, and skin: Secondary | ICD-10-CM

## 2022-01-12 MED ORDER — FLUOROURACIL 5 % EX CREA: TOPICAL_CREAM | CUTANEOUS | 2 refills | Status: DC

## 2022-01-12 NOTE — Progress Notes (Signed)
New Patient Visit  Subjective  Benjamin Little is a 56 y.o. male who presents for the following: Annual Exam (Hx of AK's. Hx of BCC's. Areas of concern today). The patient presents for Total-Body Skin Exam (TBSE) for skin cancer screening and mole check.  The patient has spots, moles and lesions to be evaluated, some may be new or changing and the patient has concerns that these could be cancer.  Review of Systems: No other skin or systemic complaints except as noted in HPI or Assessment and Plan.  Objective  Well appearing patient in no apparent distress; mood and affect are within normal limits.  A full examination was performed including scalp, head, eyes, ears, nose, lips, neck, chest, axillae, abdomen, back, buttocks, bilateral upper extremities, bilateral lower extremities, hands, feet, fingers, toes, fingernails, and toenails. All findings within normal limits unless otherwise noted below.  face, ears, neck x11, dorsal hands x23 (34) Erythematous thin papules/macules with gritty scale.   Right Temple 1.0 cm indurated pink flat plaque     Right Forearm near elbow 1.1 cm crusted papule     Right Forearm x5 Erythematous keratotic or waxy stuck-on papule or plaque.   Assessment & Plan   History of Basal Cell Carcinoma of the Skin - No evidence of recurrence today - Recommend regular full body skin exams - Recommend daily broad spectrum sunscreen SPF 30+ to sun-exposed areas, reapply every 2 hours as needed.  - Call if any new or changing lesions are noted between office visits   History of Squamous Cell Carcinoma in Situ of the Skin - No evidence of recurrence today - Recommend regular full body skin exams - Recommend daily broad spectrum sunscreen SPF 30+ to sun-exposed areas, reapply every 2 hours as needed.  - Call if any new or changing lesions are noted between office visits   Lentigines - Scattered tan macules - Due to sun exposure - Benign-appearing,  observe - Recommend daily broad spectrum sunscreen SPF 30+ to sun-exposed areas, reapply every 2 hours as needed. - Call for any changes  Seborrheic Keratoses - Stuck-on, waxy, tan-brown papules and/or plaques  - Benign-appearing - Discussed benign etiology and prognosis. - Observe - Call for any changes  Melanocytic Nevi - Tan-brown and/or pink-flesh-colored symmetric macules and papules - Benign appearing on exam today - Observation - Call clinic for new or changing moles - Recommend daily use of broad spectrum spf 30+ sunscreen to sun-exposed areas.   Hemangiomas - Red papules - Discussed benign nature - Observe - Call for any changes  Actinic Damage - Severe, confluent actinic changes with pre-cancerous actinic keratoses  - Severe, chronic, not at goal, secondary to cumulative UV radiation exposure over time - diffuse scaly erythematous macules and papules with underlying dyspigmentation - Discussed Prescription "Field Treatment" for Severe, Chronic Confluent Actinic Changes with Pre-Cancerous Actinic Keratoses Field treatment involves treatment of an entire area of skin that has confluent Actinic Changes (Sun/ Ultraviolet light damage) and PreCancerous Actinic Keratoses by method of PhotoDynamic Therapy (PDT) and/or prescription Topical Chemotherapy agents such as 5-fluorouracil, 5-fluorouracil/calcipotriene, and/or imiquimod.  The purpose is to decrease the number of clinically evident and subclinical PreCancerous lesions to prevent progression to development of skin cancer by chemically destroying early precancer changes that may or may not be visible.  It has been shown to reduce the risk of developing skin cancer in the treated area. As a result of treatment, redness, scaling, crusting, and open sores may occur during treatment course.  One or more than one of these methods may be used and may have to be used several times to control, suppress and eliminate the PreCancerous  changes. Discussed treatment course, expected reaction, and possible side effects. - Recommend daily broad spectrum sunscreen SPF 30+ to sun-exposed areas, reapply every 2 hours as needed.  - Staying in the shade or wearing long sleeves, sun glasses (UVA+UVB protection) and wide brim hats (4-inch brim around the entire circumference of the hat) are also recommended. - Call for new or changing lesions.  Start 5-fluorouracil/calcipotriene cream twice a day for 7 days to affected areas including hands, left temple, left cheek and left ear rim. Prescription sent to [facility]. Patient provided with contact information for pharmacy and advised the pharmacy will mail the prescription to their home. Patient provided with handout reviewing treatment course and side effects and advised to call or message [place] on MyChart with any concerns. Beginning September 15th  Cafe au Lait. Right abdomen - Tan patch - Birth mark - Benign appearing, observe - Call for any changes  Skin cancer screening performed today.  AK (actinic keratosis) (34) face, ears, neck x11, dorsal hands x23  Actinic keratoses are precancerous spots that appear secondary to cumulative UV radiation exposure/sun exposure over time. They are chronic with expected duration over 1 year. A portion of actinic keratoses will progress to squamous cell carcinoma of the skin. It is not possible to reliably predict which spots will progress to skin cancer and so treatment is recommended to prevent development of skin cancer.  Recommend daily broad spectrum sunscreen SPF 30+ to sun-exposed areas, reapply every 2 hours as needed.  Recommend staying in the shade or wearing long sleeves, sun glasses (UVA+UVB protection) and wide brim hats (4-inch brim around the entire circumference of the hat). Call for new or changing lesions.  Destruction of lesion - face, ears, neck x11, dorsal hands x23 Complexity: simple   Destruction method: cryotherapy    Informed consent: discussed and consent obtained   Timeout:  patient name, date of birth, surgical site, and procedure verified Lesion destroyed using liquid nitrogen: Yes   Region frozen until ice ball extended beyond lesion: Yes   Outcome: patient tolerated procedure well with no complications   Post-procedure details: wound care instructions given   Additional details:  Prior to procedure, discussed risks of blister formation, small wound, skin dyspigmentation, or rare scar following cryotherapy. Recommend Vaseline ointment to treated areas while healing.  fluorouracil (EFUDEX) 5 % cream - face, ears, neck x11, dorsal hands x23 Apply twice a day for 7 days to affected areas including hands, left temple, left cheek and left ear rim  Neoplasm of skin (2) Right Temple Skin / nail biopsy Type of biopsy: tangential   Informed consent: discussed and consent obtained   Timeout: patient name, date of birth, surgical site, and procedure verified   Procedure prep:  Patient was prepped and draped in usual sterile fashion Prep type:  Isopropyl alcohol Anesthesia: the lesion was anesthetized in a standard fashion   Anesthetic:  1% lidocaine w/ epinephrine 1-100,000 buffered w/ 8.4% NaHCO3 Instrument used: flexible razor blade   Hemostasis achieved with: pressure, aluminum chloride and electrodesiccation   Outcome: patient tolerated procedure well   Post-procedure details: sterile dressing applied and wound care instructions given   Dressing type: bandage and petrolatum    Specimen 1 - Surgical pathology Differential Diagnosis: R/O BCC Check Margins: No  Right Forearm near elbow Epidermal / dermal shaving  Lesion diameter (cm):  1.1 Informed consent: discussed and consent obtained   Timeout: patient name, date of birth, surgical site, and procedure verified   Procedure prep:  Patient was prepped and draped in usual sterile fashion Prep type:  Isopropyl alcohol Anesthesia: the lesion was  anesthetized in a standard fashion   Anesthetic:  1% lidocaine w/ epinephrine 1-100,000 buffered w/ 8.4% NaHCO3 Instrument used: flexible razor blade   Hemostasis achieved with: pressure, aluminum chloride and electrodesiccation   Outcome: patient tolerated procedure well   Post-procedure details: sterile dressing applied and wound care instructions given   Dressing type: bandage and petrolatum    Destruction of lesion Complexity: extensive   Destruction method: electrodesiccation and curettage   Informed consent: discussed and consent obtained   Timeout:  patient name, date of birth, surgical site, and procedure verified Procedure prep:  Patient was prepped and draped in usual sterile fashion Prep type:  Isopropyl alcohol Anesthesia: the lesion was anesthetized in a standard fashion   Anesthetic:  1% lidocaine w/ epinephrine 1-100,000 buffered w/ 8.4% NaHCO3 Curettage performed in three different directions: Yes   Electrodesiccation performed over the curetted area: Yes   Curettage cycles:  3 Lesion length (cm):  1.1 Lesion width (cm):  1.1 Margin per side (cm):  0.2 Final wound size (cm):  1.5 Hemostasis achieved with:  pressure and aluminum chloride Outcome: patient tolerated procedure well with no complications   Post-procedure details: sterile dressing applied and wound care instructions given   Dressing type: bandage and petrolatum    Specimen 2 - Surgical pathology Differential Diagnosis: SCC vs other Check Margins: No  Plan Mohs on right temple pending pathology results  Inflamed seborrheic keratosis Right Forearm x5 Symptomatic, irritating, patient would like treated.  Destruction of lesion - Right Forearm x5 Complexity: simple   Destruction method: cryotherapy   Informed consent: discussed and consent obtained   Timeout:  patient name, date of birth, surgical site, and procedure verified Lesion destroyed using liquid nitrogen: Yes   Region frozen until ice ball  extended beyond lesion: Yes   Outcome: patient tolerated procedure well with no complications   Post-procedure details: wound care instructions given    Return in about 6 months (around 07/15/2022) for AK Follow Up.  I, Emelia Salisbury, CMA, am acting as scribe for Sarina Ser, MD. Documentation: I have reviewed the above documentation for accuracy and completeness, and I agree with the above.  Sarina Ser, MD

## 2022-01-12 NOTE — Patient Instructions (Addendum)
Cryotherapy Aftercare  Wash gently with soap and water everyday.   Apply Vaseline and Band-Aid daily until healed.   Wound Care Instructions  Cleanse wound gently with soap and water once a day then pat dry with clean gauze. Apply a thin coat of Petrolatum (petroleum jelly, "Vaseline") over the wound (unless you have an allergy to this). We recommend that you use a new, sterile tube of Vaseline. Do not pick or remove scabs. Do not remove the yellow or white "healing tissue" from the base of the wound.  Cover the wound with fresh, clean, nonstick gauze and secure with paper tape. You may use Band-Aids in place of gauze and tape if the wound is small enough, but would recommend trimming much of the tape off as there is often too much. Sometimes Band-Aids can irritate the skin.  You should call the office for your biopsy report after 1 week if you have not already been contacted.  If you experience any problems, such as abnormal amounts of bleeding, swelling, significant bruising, significant pain, or evidence of infection, please call the office immediately.  FOR ADULT SURGERY PATIENTS: If you need something for pain relief you may take 1 extra strength Tylenol (acetaminophen) AND 2 Ibuprofen ('200mg'$  each) together every 4 hours as needed for pain. (do not take these if you are allergic to them or if you have a reason you should not take them.) Typically, you may only need pain medication for 1 to 3 days.   Start 5-fluorouracil/calcipotriene cream twice a day for 7 days to affected areas including hands, left temple, left cheek and left ear rim. Prescription sent to [facility]. Patient provided with contact information for pharmacy and advised the pharmacy will mail the prescription to their home. Patient provided with handout reviewing treatment course and side effects and advised to call or message [place] on MyChart with any concerns. Beginning September 15th   5-Fluorouracil/Calcipotriene  Patient Education   Actinic keratoses are the dry, red scaly spots on the skin caused by sun damage. A portion of these spots can turn into skin cancer with time, and treating them can help prevent development of skin cancer.   Treatment of these spots requires removal of the defective skin cells. There are various ways to remove actinic keratoses, including freezing with liquid nitrogen, treatment with creams, or treatment with a blue light procedure in the office.   5-fluorouracil cream is a topical cream used to treat actinic keratoses. It works by interfering with the growth of abnormal fast-growing skin cells, such as actinic keratoses. These cells peel off and are replaced by healthy ones.   5-fluorouracil/calcipotriene is a combination of the 5-fluorouracil cream with a vitamin D analog cream called calcipotriene. The calcipotriene alone does not treat actinic keratoses. However, when it is combined with 5-fluorouracil, it helps the 5-fluorouracil treat the actinic keratoses much faster so that the same results can be achieved with a much shorter treatment time.  INSTRUCTIONS FOR 5-FLUOROURACIL/CALCIPOTRIENE CREAM:   5-fluorouracil/calcipotriene cream typically only needs to be used for 4-7 days. A thin layer should be applied twice a day to the treatment areas recommended by your physician.   If your physician prescribed you separate tubes of 5-fluourouracil and calcipotriene, apply a thin layer of 5-fluorouracil followed by a thin layer of calcipotriene.   Avoid contact with your eyes, nostrils, and mouth. Do not use 5-fluorouracil/calcipotriene cream on infected or open wounds.   You will develop redness, irritation and some crusting at areas  where you have pre-cancer damage/actinic keratoses. IF YOU DEVELOP PAIN, BLEEDING, OR SIGNIFICANT CRUSTING, STOP THE TREATMENT EARLY - you have already gotten a good response and the actinic keratoses should clear up well.  Wash your hands after  applying 5-fluorouracil 5% cream on your skin.   A moisturizer or sunscreen with a minimum SPF 30 should be applied each morning.   Once you have finished the treatment, you can apply a thin layer of Vaseline twice a day to irritated areas to soothe and calm the areas more quickly. If you experience significant discomfort, contact your physician.  For some patients it is necessary to repeat the treatment for best results.  SIDE EFFECTS: When using 5-fluorouracil/calcipotriene cream, you may have mild irritation, such as redness, dryness, swelling, or a mild burning sensation. This usually resolves within 2 weeks. The more actinic keratoses you have, the more redness and inflammation you can expect during treatment. Eye irritation has been reported rarely. If this occurs, please let us know.  If you have any trouble using this cream, please call the office. If you have any other questions about this information, please do not hesitate to ask me before you leave the office.    Melanoma ABCDEs  Melanoma is the most dangerous type of skin cancer, and is the leading cause of death from skin disease.  You are more likely to develop melanoma if you: Have light-colored skin, light-colored eyes, or red or blond hair Spend a lot of time in the sun Tan regularly, either outdoors or in a tanning bed Have had blistering sunburns, especially during childhood Have a close family member who has had a melanoma Have atypical moles or large birthmarks  Early detection of melanoma is key since treatment is typically straightforward and cure rates are extremely high if we catch it early.   The first sign of melanoma is often a change in a mole or a new dark spot.  The ABCDE system is a way of remembering the signs of melanoma.  A for asymmetry:  The two halves do not match. B for border:  The edges of the growth are irregular. C for color:  A mixture of colors are present instead of an even brown color. D  for diameter:  Melanomas are usually (but not always) greater than 55m - the size of a pencil eraser. E for evolution:  The spot keeps changing in size, shape, and color.  Please check your skin once per month between visits. You can use a small mirror in front and a large mirror behind you to keep an eye on the back side or your body.   If you see any new or changing lesions before your next follow-up, please call to schedule a visit.  Please continue daily skin protection including broad spectrum sunscreen SPF 30+ to sun-exposed areas, reapplying every 2 hours as needed when you're outdoors.   Staying in the shade or wearing long sleeves, sun glasses (UVA+UVB protection) and wide brim hats (4-inch brim around the entire circumference of the hat) are also recommended for sun protection.     Due to recent changes in healthcare laws, you may see results of your pathology and/or laboratory studies on MyChart before the doctors have had a chance to review them. We understand that in some cases there may be results that are confusing or concerning to you. Please understand that not all results are received at the same time and often the doctors may need to interpret  multiple results in order to provide you with the best plan of care or course of treatment. Therefore, we ask that you please give [place] 2 business days to thoroughly review all your results before contacting the office for clarification. Should we see a critical lab result, you will be contacted sooner.   If You Need Anything After Your Visit  If you have any questions or concerns for your doctor, please call our main line at [phone] and press option 4 to reach your doctor's medical assistant. If no one answers, please leave a voicemail as directed and we will return your call as soon as possible. Messages left after 4 pm will be answered the following business day.   You may also send [place] a message via [place]. We typically respond to  MyChart messages within 1-2 business days.  For prescription refills, please ask your pharmacy to contact our office. Our fax number is [phone].  If you have an urgent issue when the clinic is closed that cannot wait until the next business day, you can page your doctor at the number below.    Please note that while we do our best to be available for urgent issues outside of office hours, we are not available 24/7.   If you have an urgent issue and are unable to reach [place], you may choose to seek medical care at your doctor's office, retail clinic, urgent care center, or emergency room.  If you have a medical emergency, please immediately call 911 or go to the emergency department.  Pager Numbers  - Dr. Nehemiah Massed: [phone]  - Dr. Laurence Ferrari: [phone]  - Dr. Nicole Kindred: [phone]  In the event of inclement weather, please call our main line at [phone] for an update on the status of any delays or closures.  Dermatology Medication Tips: Please keep the boxes that topical medications come in in order to help keep track of the instructions about where and how to use these. Pharmacies typically print the medication instructions only on the boxes and not directly on the medication tubes.   If your medication is too expensive, please contact our office at [phone] option 4 or send [place] a message through [place].   We are unable to tell what your co-pay for medications will be in advance as this is different depending on your insurance coverage. However, we may be able to find a substitute medication at lower cost or fill out paperwork to get insurance to cover a needed medication.   If a prior authorization is required to get your medication covered by your insurance company, please allow [place] 1-2 business days to complete this process.  Drug prices often vary depending on where the prescription is filled and some pharmacies may offer cheaper prices.  The website www.goodrx.com  contains coupons for medications through different pharmacies. The prices here do not account for what the cost may be with help from insurance (it may be cheaper with your insurance), but the website can give you the price if you did not use any insurance.  - You can print the associated coupon and take it with your prescription to the pharmacy.  - You may also stop by our office during regular business hours and pick up a GoodRx coupon card.  - If you need your prescription sent electronically to a different pharmacy, notify our office through [facility] or by phone at [phone] option 4.     Si Usted Necesita Algo Despus de Su Visita  Tambin puede enviarnos un mensaje a travs de MyChart. Por lo general respondemos a los mensajes de MyChart en el transcurso de 1 a 2 das hbiles.  Para renovar recetas, por favor pida a su farmacia que se ponga en contacto con nuestra oficina. Harland Dingwall de fax es [place] [phone].  Si tiene un asunto urgente cuando la clnica est cerrada y que no puede esperar hasta el siguiente da hbil, puede llamar/localizar a su doctor(a) al nmero que aparece a continuacin.   Por favor, tenga en cuenta que aunque hacemos todo lo posible para estar disponibles para asuntos urgentes fuera del horario de [place], no estamos disponibles las 24 horas del da, los 7 das de la [place].   Si tiene un problema urgente y no puede comunicarse con nosotros, puede optar por buscar atencin mdica  en el consultorio de su doctor(a), en una clnica privada, en un centro de atencin urgente o en una sala de emergencias.  Si tiene Engineering geologist, por favor llame inmediatamente al 911 o vaya a la sala de emergencias.  Nmeros de bper  - Dr. Nehemiah Massed: [phone]  - Dra. Moye: [phone]  - Dra. Nicole Kindred: [phone]  En caso de inclemencias del [place], por favor llame a Johnsie Kindred principal al [phone] para una actualizacin sobre el [place]  de cualquier retraso o cierre.  Consejos para la medicacin en dermatologa: Por favor, guarde las cajas en las que vienen los medicamentos de uso tpico para ayudarle a seguir las instrucciones sobre dnde y cmo usarlos. Las farmacias generalmente imprimen las instrucciones del medicamento slo en las cajas y no directamente en los tubos del [place].   Si su medicamento es muy caro, por favor, pngase en contacto con Zigmund Daniel llamando al [phone] y presione la opcin 4 o envenos un mensaje a travs de Pharmacist, community.   No podemos decirle cul ser su copago por los medicamentos por adelantado ya que esto es diferente dependiendo de la cobertura de su seguro. Sin embargo, es posible que podamos encontrar un medicamento sustituto a Electrical engineer un formulario para que el seguro cubra el medicamento que se considera necesario.   Si se requiere una autorizacin previa para que su compaa de seguros [place] su medicamento, por favor permtanos de 1 a 2 das hbiles para completar este proceso.  Los precios de los medicamentos varan con frecuencia dependiendo del Environmental consultant de dnde se surte la receta y alguna farmacias pueden ofrecer precios ms baratos.  El sitio web www.goodrx.com tiene cupones para medicamentos de Airline pilot. Los precios aqu no tienen en cuenta lo que podra costar con la ayuda del seguro (puede ser ms barato con su seguro), pero el sitio web puede darle el precio si no utiliz Research scientist (physical sciences).  - Puede imprimir el cupn correspondiente y llevarlo con su receta a la farmacia.  - Tambin puede pasar por nuestra oficina durante el horario de atencin regular y Charity fundraiser una tarjeta de cupones de GoodRx.  - Si necesita que su receta se enve electrnicamente a una farmacia diferente, informe a nuestra oficina a travs de MyChart de [place] o por telfono llamando al [phone] y presione la opcin 4.

## 2022-01-18 ENCOUNTER — Encounter: Payer: Self-pay | Admitting: Dermatology

## 2022-01-19 ENCOUNTER — Telehealth: Payer: Self-pay

## 2022-01-19 NOTE — Telephone Encounter (Signed)
Left voicemail to return my call

## 2022-01-19 NOTE — Telephone Encounter (Signed)
-----   Message from Ralene Bathe, MD sent at 01/17/2022 11:15 AM EDT ----- Diagnosis 1. 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 Skin , right forearm near elbow BASAL CELL CARCINOMA WITH FOCAL SCLEROSIS, CRUSTED, SEE DESCRIPTION  1- Cancer - BCC Schedule for MOHS 2- Cancer - BCC Already treated Recheck next visit

## 2022-01-24 ENCOUNTER — Telehealth: Payer: Self-pay

## 2022-01-24 DIAGNOSIS — C44319 Basal cell carcinoma of skin of other parts of face: Secondary | ICD-10-CM

## 2022-01-24 NOTE — Telephone Encounter (Signed)
Patient informed of pathology results. Referral emailed to [facility] Mohs clinic.

## 2022-01-24 NOTE — Telephone Encounter (Signed)
-----   Message from Ralene Bathe, MD sent at 01/17/2022 11:15 AM EDT ----- Diagnosis 1. 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 Skin , right forearm near elbow BASAL CELL CARCINOMA WITH FOCAL SCLEROSIS, CRUSTED, SEE DESCRIPTION  1- Cancer - BCC Schedule for MOHS 2- Cancer - BCC Already treated Recheck next visit

## 2022-06-02 ENCOUNTER — Telehealth: Payer: Self-pay

## 2022-06-02 NOTE — Telephone Encounter (Signed)
Updated specimen tracking and history from [facility] progress notes/photos. aw

## 2022-07-27 ENCOUNTER — Ambulatory Visit: Payer: 59 | Admitting: Dermatology

## 2022-07-27 DIAGNOSIS — L578 Other skin changes due to chronic exposure to nonionizing radiation: Secondary | ICD-10-CM | POA: Diagnosis not present

## 2022-07-27 DIAGNOSIS — L570 Actinic keratosis: Secondary | ICD-10-CM

## 2022-07-27 DIAGNOSIS — D492 Neoplasm of unspecified behavior of bone, soft tissue, and skin: Secondary | ICD-10-CM

## 2022-07-27 DIAGNOSIS — Z79899 Other long term (current) drug therapy: Secondary | ICD-10-CM

## 2022-07-27 DIAGNOSIS — C4441 Basal cell carcinoma of skin of scalp and neck: Secondary | ICD-10-CM | POA: Diagnosis not present

## 2022-07-27 DIAGNOSIS — Z85828 Personal history of other malignant neoplasm of skin: Secondary | ICD-10-CM

## 2022-07-27 DIAGNOSIS — Z5111 Encounter for antineoplastic chemotherapy: Secondary | ICD-10-CM | POA: Diagnosis not present

## 2022-07-27 DIAGNOSIS — Z7189 Other specified counseling: Secondary | ICD-10-CM

## 2022-07-27 MED ORDER — FLUOROURACIL 5 % EX CREA: TOPICAL_CREAM | CUTANEOUS | 2 refills | Status: AC

## 2022-07-27 NOTE — Patient Instructions (Addendum)
Instructions for [facility] Medications  One or more of your medications was sent to the [facility] mail order compounding pharmacy. You will receive an email from them and can purchase the medicine through that link. It will then be mailed to your home at the address you confirmed. If for any reason you do not receive an email from them, please check your spam folder. If you still do not find the email, please let us know. [facility] phone number is [phone].       5-Fluorouracil/Calcipotriene Patient Education   Actinic keratoses are the dry, red scaly spots on the skin caused by sun damage. A portion of these spots can turn into skin cancer with time, and treating them can help prevent development of skin cancer.   Treatment of these spots requires removal of the defective skin cells. There are various ways to remove actinic keratoses, including freezing with liquid nitrogen, treatment with creams, or treatment with a blue light procedure in the office.   5-fluorouracil cream is a topical cream used to treat actinic keratoses. It works by interfering with the growth of abnormal fast-growing skin cells, such as actinic keratoses. These cells peel off and are replaced by healthy ones.   5-fluorouracil/calcipotriene is a combination of the 5-fluorouracil cream with a vitamin D analog cream called calcipotriene. The calcipotriene alone does not treat actinic keratoses. However, when it is combined with 5-fluorouracil, it helps the 5-fluorouracil treat the actinic keratoses much faster so that the same results can be achieved with a much shorter treatment time.  INSTRUCTIONS FOR 5-FLUOROURACIL/CALCIPOTRIENE CREAM:   5-fluorouracil/calcipotriene cream typically only needs to be used for 4-7 days. A thin layer should be applied twice a day to the treatment areas recommended by your physician.   If your physician prescribed you separate tubes of 5-fluourouracil and calcipotriene,  apply a thin layer of 5-fluorouracil followed by a thin layer of calcipotriene.   Avoid contact with your eyes, nostrils, and mouth. Do not use 5-fluorouracil/calcipotriene cream on infected or open wounds.   You will develop redness, irritation and some crusting at areas where you have pre-cancer damage/actinic keratoses. IF YOU DEVELOP PAIN, BLEEDING, OR SIGNIFICANT CRUSTING, STOP THE TREATMENT EARLY - you have already gotten a good response and the actinic keratoses should clear up well.  Wash your hands after applying 5-fluorouracil 5% cream on your skin.   A moisturizer or sunscreen with a minimum SPF 30 should be applied each morning.   Once you have finished the treatment, you can apply a thin layer of Vaseline twice a day to irritated areas to soothe and calm the areas more quickly. If you experience significant discomfort, contact your physician.  For some patients it is necessary to repeat the treatment for best results.  SIDE EFFECTS: When using 5-fluorouracil/calcipotriene cream, you may have mild irritation, such as redness, dryness, swelling, or a mild burning sensation. This usually resolves within 2 weeks. The more actinic keratoses you have, the more redness and inflammation you can expect during treatment. Eye irritation has been reported rarely. If this occurs, please let us know.  If you have any trouble using this cream, please call the office. If you have any other questions about this information, please do not hesitate to ask me before you leave the office.    Wound Care Instructions  Cleanse wound gently with soap and water once a day then pat dry with clean gauze. Apply a thin coat of Petrolatum (petroleum jelly, "Vaseline") over  the wound (unless you have an allergy to this). We recommend that you use a new, sterile tube of Vaseline. Do not pick or remove scabs. Do not remove the yellow or white "healing tissue" from the base of the wound.  Cover the wound with  fresh, clean, nonstick gauze and secure with paper tape. You may use Band-Aids in place of gauze and tape if the wound is small enough, but would recommend trimming much of the tape off as there is often too much. Sometimes Band-Aids can irritate the skin.  You should call the office for your biopsy report after 1 week if you have not already been contacted.  If you experience any problems, such as abnormal amounts of bleeding, swelling, significant bruising, significant pain, or evidence of infection, please call the office immediately.  FOR ADULT SURGERY PATIENTS: If you need something for pain relief you may take 1 extra strength Tylenol (acetaminophen) AND 2 Ibuprofen (263m each) together every 4 hours as needed for pain. (do not take these if you are allergic to them or if you have a reason you should not take them.) Typically, you may only need pain medication for 1 to 3 days.         Cryotherapy Aftercare  Wash gently with soap and water everyday.   Apply Vaseline and Band-Aid daily until healed.     Due to recent changes in healthcare laws, you may see results of your pathology and/or laboratory studies on MyChart before the doctors have had a chance to review them. We understand that in some cases there may be results that are confusing or concerning to you. Please understand that not all results are received at the same time and often the doctors may need to interpret multiple results in order to provide you with the best plan of care or course of treatment. Therefore, we ask that you please give u[place]2 business days to thoroughly review all your results before contacting the office for clarification. Should we see a critical lab result, you will be contacted sooner.   If You Need Anything After Your Visit  If you have any questions or concerns for your doctor, please call our main line at [phone] press option 4 to reach your doctor's medical assistant. If no one answers,  please leave a voicemail as directed and we will return your call as soon as possible. Messages left after 4 pm will be answered the following business day.   You may also send [place] message via [place] We typically respond to MyChart messages within 1-2 business days.  For prescription refills, please ask your pharmacy to contact our office. Our fax number is [phone]  If you have an urgent issue when the clinic is closed that cannot wait until the next business day, you can page your doctor at the number below.    Please note that while we do our best to be available for urgent issues outside of office hours, we are not available 24/7.   If you have an urgent issue and are unable to reach u[place] you may choose to seek medical care at your doctor's office, retail clinic, urgent care center, or emergency room.  If you have a medical emergency, please immediately call 911 or go to the emergency department.  Pager Numbers  - Dr. KNehemiah Massed [phone] - Dr. MLaurence Ferrari [phone] - Dr. SNicole Kindred [phone] In the event of inclement weather, please call our main line at [phone]  an update on the status of any delays or closures.  Dermatology Medication Tips: Please keep the boxes that topical medications come in in order to help keep track of the instructions about where and how to use these. Pharmacies typically print the medication instructions only on the boxes and not directly on the medication tubes.   If your medication is too expensive, please contact our office at [phone] option 4 or send [place] a message through [place].   We are unable to tell what your co-pay for medications will be in advance as this is different depending on your insurance coverage. However, we may be able to find a substitute medication at lower cost or fill out paperwork to get insurance to cover a needed medication.   If a prior authorization is required to get your medication covered by your  insurance company, please allow [place] 1-2 business days to complete this process.  Drug prices often vary depending on where the prescription is filled and some pharmacies may offer cheaper prices.  The website www.goodrx.com contains coupons for medications through different pharmacies. The prices here do not account for what the cost may be with help from insurance (it may be cheaper with your insurance), but the website can give you the price if you did not use any insurance.  - You can print the associated coupon and take it with your prescription to the pharmacy.  - You may also stop by our office during regular business hours and pick up a GoodRx coupon card.  - If you need your prescription sent electronically to a different pharmacy, notify our office through [facility] or by phone at [phone] option 4.     Si Usted Necesita Algo Despus de Su Visita  Tambin puede enviarnos un mensaje a travs de Pharmacist, community. Por lo general respondemos a los mensajes de MyChart en el transcurso de 1 a 2 das hbiles.  Para renovar recetas, por favor pida a su farmacia que se ponga en contacto con nuestra oficina. Harland Dingwall de fax es [place] [phone].  Si tiene un asunto urgente cuando la clnica est cerrada y que no puede esperar hasta el siguiente da hbil, puede llamar/localizar a su doctor(a) al nmero que aparece a continuacin.   Por favor, tenga en cuenta que aunque hacemos todo lo posible para estar disponibles para asuntos urgentes fuera del horario de [place], no estamos disponibles las 24 horas del da, los 7 das de la [place].   Si tiene un problema urgente y no puede comunicarse con nosotros, puede optar por buscar atencin mdica  en el consultorio de su doctor(a), en una clnica privada, en un centro de atencin urgente o en una sala de emergencias.  Si tiene Engineering geologist, por favor llame inmediatamente al 911 o vaya a la sala de emergencias.  Nmeros de  bper  - Dr. Nehemiah Massed: [phone]  - Dra. Moye: [phone]  - Dra. Nicole Kindred: [phone]  En caso de inclemencias del [place], por favor llame a Johnsie Kindred principal al [phone] para una actualizacin sobre el [place] de cualquier retraso o cierre.  Consejos para la medicacin en dermatologa: Por favor, guarde las cajas en las que vienen los medicamentos de uso tpico para ayudarle a seguir las instrucciones sobre dnde y cmo usarlos. Las farmacias generalmente imprimen las instrucciones del medicamento slo en las cajas y no directamente en los tubos del [employer].   Si su medicamento es [employer], por favor, pngase en contacto con Cleotis Nipper oficina  llamando al [phone] y presione la opcin 4 o envenos un mensaje a travs de Pharmacist, community.   No podemos decirle cul ser su copago por los medicamentos por adelantado ya que esto es diferente dependiendo de la cobertura de su seguro. Sin embargo, es posible que podamos encontrar un medicamento sustituto a Electrical engineer un formulario para que el seguro cubra el medicamento que se considera necesario.   Si se requiere una autorizacin previa para que su compaa de seguros [place] su medicamento, por favor permtanos de 1 a 2 das hbiles para completar este proceso.  Los precios de los medicamentos varan con frecuencia dependiendo del Environmental consultant de dnde se surte la receta y alguna farmacias pueden ofrecer precios ms baratos.  El sitio web www.goodrx.com tiene cupones para medicamentos de Airline pilot. Los precios aqu no tienen en cuenta lo que podra costar con la ayuda del seguro (puede ser ms barato con su seguro), pero el sitio web puede darle el precio si no utiliz Research scientist (physical sciences).  - Puede imprimir el cupn correspondiente y llevarlo con su receta a la farmacia.  - Tambin puede pasar por nuestra oficina durante el horario de atencin regular y Charity fundraiser una tarjeta de cupones de GoodRx.  - Si necesita que su receta se  enve electrnicamente a una farmacia diferente, informe a nuestra oficina a travs de MyChart de [place] o por telfono llamando al [phone] y presione la opcin 4.

## 2022-07-27 NOTE — Progress Notes (Signed)
Follow-Up Visit   Subjective  Benjamin Little is a 57 y.o. male who presents for the following: Follow-up (6 months f/u on precancers sun exposed skin ). Hx of BCC- right temple treated with mohs surgery 05/24/2022.   The patient has spots, moles and lesions to be evaluated, some may be new or changing and the patient has concerns that these could be cancer.  The following portions of the chart were reviewed this encounter and updated as appropriate:   Tobacco  Allergies  Meds  Problems  Med Hx  Surg Hx  Fam Hx     Review of Systems:  No other skin or systemic complaints except as noted in HPI or Assessment and Plan.  Objective  Well appearing patient in no apparent distress; mood and affect are within normal limits.  A focused examination was performed including face,arms,hands. Relevant physical exam findings are noted in the Assessment and Plan.  left ear x 4, neck, face x 12, hands x 7  (23) (23) Erythematous thin papules/macules with gritty scale.   left post auricular 1.0 cm pearly papule           Assessment & Plan  AK (actinic keratosis) (23) left ear x 4, neck, face x 12, hands x 7  (23)  Actinic keratoses are precancerous spots that appear secondary to cumulative UV radiation exposure/sun exposure over time. They are chronic with expected duration over 1 year. A portion of actinic keratoses will progress to squamous cell carcinoma of the skin. It is not possible to reliably predict which spots will progress to skin cancer and so treatment is recommended to prevent development of skin cancer.  Recommend daily broad spectrum sunscreen SPF 30+ to sun-exposed areas, reapply every 2 hours as needed.  Recommend staying in the shade or wearing long sleeves, sun glasses (UVA+UVB protection) and wide brim hats (4-inch brim around the entire circumference of the hat). Call for new or changing lesions.   Destruction of lesion - left ear x 4, neck, face x 12, hands  x 7  (23) Complexity: simple   Destruction method: cryotherapy   Informed consent: discussed and consent obtained   Timeout:  patient name, date of birth, surgical site, and procedure verified Lesion destroyed using liquid nitrogen: Yes   Region frozen until ice ball extended beyond lesion: Yes   Outcome: patient tolerated procedure well with no complications   Post-procedure details: wound care instructions given    Related Medications fluorouracil (EFUDEX) 5 % cream Apply to arms and hands twice a day  Neoplasm of skin left post auricular  Skin / nail biopsy Type of biopsy: tangential   Informed consent: discussed and consent obtained   Patient was prepped and draped in usual sterile fashion: area prepped with alochol. Anesthesia: the lesion was anesthetized in a standard fashion   Anesthetic:  1% lidocaine w/ epinephrine 1-100,000 buffered w/ 8.4% NaHCO3 Instrument used: flexible razor blade   Hemostasis achieved with: pressure, aluminum chloride and electrodesiccation   Outcome: patient tolerated procedure well   Post-procedure details: wound care instructions given   Post-procedure details comment:  Ointment and small bandage  Specimen 1 - Surgical pathology Differential Diagnosis: R/O BCC  Check Margins: No  Actinic Damage with PreCancerous Actinic Keratoses Counseling for Topical Chemotherapy Management: Patient exhibits: - Severe, confluent actinic changes with pre-cancerous actinic keratoses that is secondary to cumulative UV radiation exposure over time - Condition that is severe; chronic, not at goal. - diffuse scaly erythematous macules  and papules with underlying dyspigmentation - Discussed Prescription "Field Treatment" topical Chemotherapy for Severe, Chronic Confluent Actinic Changes with Pre-Cancerous Actinic Keratoses  Start 5FU/Calcipotriene cream apply to hands and forearms twice a day for 7 days   Field treatment involves treatment of an entire area of  skin that has confluent Actinic Changes (Sun/ Ultraviolet light damage) and PreCancerous Actinic Keratoses by method of PhotoDynamic Therapy (PDT) and/or prescription Topical Chemotherapy agents such as 5-fluorouracil, 5-fluorouracil/calcipotriene, and/or imiquimod.  The purpose is to decrease the number of clinically evident and subclinical PreCancerous lesions to prevent progression to development of skin cancer by chemically destroying early precancer changes that may or may not be visible.  It has been shown to reduce the risk of developing skin cancer in the treated area. As a result of treatment, redness, scaling, crusting, and open sores may occur during treatment course. One or more than one of these methods may be used and may have to be used several times to control, suppress and eliminate the PreCancerous changes. Discussed treatment course, expected reaction, and possible side effects. - Recommend daily broad spectrum sunscreen SPF 30+ to sun-exposed areas, reapply every 2 hours as needed.  - Staying in the shade or wearing long sleeves, sun glasses (UVA+UVB protection) and wide brim hats (4-inch brim around the entire circumference of the hat) are also recommended. - Call for new or changing lesions.   History of Basal Cell Carcinoma of the Skin - No evidence of recurrence today - Recommend regular full body skin exams - Recommend daily broad spectrum sunscreen SPF 30+ to sun-exposed areas, reapply every 2 hours as needed.  - Call if any new or changing lesions are noted between office visits   Return in about 6 months (around 01/25/2023) for TBSE, hx of BCC.  IMarye Round, CMA, am acting as scribe for Sarina Ser, MD .  Documentation: I have reviewed the above documentation for accuracy and completeness, and I agree with the above.  Sarina Ser, MD

## 2022-08-02 ENCOUNTER — Encounter: Payer: Self-pay | Admitting: Dermatology

## 2022-08-02 ENCOUNTER — Telehealth: Payer: Self-pay

## 2022-08-02 NOTE — Telephone Encounter (Signed)
LM on VM please return my call to discuss biopsy results   , left post auricular BASAL CELL CARCINOMA, NODULAR AND INFILTRATIVE PATTERNS  Cancer - BCC  With infiltrative patterns Schedule for MOHS

## 2022-08-02 NOTE — Telephone Encounter (Signed)
-----   Message from Ralene Bathe, MD sent at 08/01/2022  6:06 PM EST ----- Diagnosis Skin , left post auricular BASAL CELL CARCINOMA, NODULAR AND INFILTRATIVE PATTERNS  Cancer - BCC  With infiltrative patterns Schedule for MOHS

## 2022-08-04 ENCOUNTER — Other Ambulatory Visit: Payer: Self-pay

## 2022-08-04 ENCOUNTER — Telehealth: Payer: Self-pay

## 2022-08-04 DIAGNOSIS — C44319 Basal cell carcinoma of skin of other parts of face: Secondary | ICD-10-CM

## 2022-08-04 NOTE — Telephone Encounter (Signed)
Patient advised of results and referral for Mohs surgery sent to Dr. Roderic Palau Cook/hd

## 2022-08-04 NOTE — Telephone Encounter (Signed)
-----   Message from Ralene Bathe, MD sent at 08/01/2022  6:06 PM EST ----- Diagnosis Skin , left post auricular BASAL CELL CARCINOMA, NODULAR AND INFILTRATIVE PATTERNS  Cancer - BCC  With infiltrative patterns Schedule for MOHS

## 2023-01-17 ENCOUNTER — Telehealth: Payer: Self-pay

## 2023-01-17 NOTE — Telephone Encounter (Signed)
Updated specimen tracking and history from MOHs progress notes and photos. aw ?

## 2023-02-08 ENCOUNTER — Encounter: Payer: Self-pay | Admitting: Dermatology

## 2023-02-08 ENCOUNTER — Ambulatory Visit: Payer: 59 | Admitting: Dermatology

## 2023-02-08 VITALS — BP 142/86 | HR 86

## 2023-02-08 DIAGNOSIS — Z85828 Personal history of other malignant neoplasm of skin: Secondary | ICD-10-CM

## 2023-02-08 DIAGNOSIS — Z8589 Personal history of malignant neoplasm of other organs and systems: Secondary | ICD-10-CM

## 2023-02-08 DIAGNOSIS — L821 Other seborrheic keratosis: Secondary | ICD-10-CM

## 2023-02-08 DIAGNOSIS — Z79899 Other long term (current) drug therapy: Secondary | ICD-10-CM

## 2023-02-08 DIAGNOSIS — D1801 Hemangioma of skin and subcutaneous tissue: Secondary | ICD-10-CM

## 2023-02-08 DIAGNOSIS — L570 Actinic keratosis: Secondary | ICD-10-CM | POA: Diagnosis not present

## 2023-02-08 DIAGNOSIS — L820 Inflamed seborrheic keratosis: Secondary | ICD-10-CM

## 2023-02-08 DIAGNOSIS — L814 Other melanin hyperpigmentation: Secondary | ICD-10-CM

## 2023-02-08 DIAGNOSIS — L578 Other skin changes due to chronic exposure to nonionizing radiation: Secondary | ICD-10-CM | POA: Diagnosis not present

## 2023-02-08 DIAGNOSIS — Z1283 Encounter for screening for malignant neoplasm of skin: Secondary | ICD-10-CM

## 2023-02-08 DIAGNOSIS — Z86007 Personal history of in-situ neoplasm of skin: Secondary | ICD-10-CM

## 2023-02-08 DIAGNOSIS — W908XXA Exposure to other nonionizing radiation, initial encounter: Secondary | ICD-10-CM | POA: Diagnosis not present

## 2023-02-08 DIAGNOSIS — Z7189 Other specified counseling: Secondary | ICD-10-CM

## 2023-02-08 DIAGNOSIS — Z5111 Encounter for antineoplastic chemotherapy: Secondary | ICD-10-CM

## 2023-02-08 DIAGNOSIS — Z872 Personal history of diseases of the skin and subcutaneous tissue: Secondary | ICD-10-CM

## 2023-02-08 NOTE — Progress Notes (Unsigned)
Follow-Up Visit   Subjective  Benjamin Little is a 57 y.o. male who presents for the following: Skin Cancer Screening and Full Body Skin Exam hx of aks, hx of isks, hx of bcc, hx of sccis The patient presents for Total-Body Skin Exam (TBSE) for skin cancer screening and mole check. The patient has spots, moles and lesions to be evaluated, some may be new or changing and the patient may have concern these could be cancer.   The following portions of the chart were reviewed this encounter and updated as appropriate: medications, allergies, medical history  Review of Systems:  No other skin or systemic complaints except as noted in HPI or Assessment and Plan.  Objective  Well appearing patient in no apparent distress; mood and affect are within normal limits.  A full examination was performed including scalp, head, eyes, ears, nose, lips, neck, chest, axillae, abdomen, back, buttocks, bilateral upper extremities, bilateral lower extremities, hands, feet, fingers, toes, fingernails, and toenails. All findings within normal limits unless otherwise noted below.   Relevant physical exam findings are noted in the Assessment and Plan.  right hand and arm x 20, left hand x 6 (26) Erythematous thin papules/macules with gritty scale.   b/l legs x 7 (7) Erythematous stuck-on, waxy papule or plaque    Assessment & Plan   SKIN CANCER SCREENING PERFORMED TODAY.  ACTINIC DAMAGE WITH PRECANCEROUS ACTINIC KERATOSES Counseling for Topical Chemotherapy Management: Patient exhibits: - Severe, confluent actinic changes with pre-cancerous actinic keratoses that is secondary to cumulative UV radiation exposure over time - Condition that is severe; chronic, not at goal. - diffuse scaly erythematous macules and papules with underlying dyspigmentation - Discussed Prescription "Field Treatment" topical Chemotherapy for Severe, Chronic Confluent Actinic Changes with Pre-Cancerous Actinic Keratoses Field  treatment involves treatment of an entire area of skin that has confluent Actinic Changes (Sun/ Ultraviolet light damage) and PreCancerous Actinic Keratoses by method of PhotoDynamic Therapy (PDT) and/or prescription Topical Chemotherapy agents such as 5-fluorouracil, 5-fluorouracil/calcipotriene, and/or imiquimod.  The purpose is to decrease the number of clinically evident and subclinical PreCancerous lesions to prevent progression to development of skin cancer by chemically destroying early precancer changes that may or may not be visible.  It has been shown to reduce the risk of developing skin cancer in the treated area. As a result of treatment, redness, scaling, crusting, and open sores may occur during treatment course. One or more than one of these methods may be used and may have to be used several times to control, suppress and eliminate the PreCancerous changes. Discussed treatment course, expected reaction, and possible side effects. - Recommend daily broad spectrum sunscreen SPF 30+ to sun-exposed areas, reapply every 2 hours as needed.  - Staying in the shade or wearing long sleeves, sun glasses (UVA+UVB protection) and wide brim hats (4-inch brim around the entire circumference of the hat) are also recommended. - Call for new or changing lesions. Restart cream in 1 month  - Start 5-fluorouracil/calcipotriene cream twice a day for 10 days to affected areas including top of hands. Prescription sent to [facility]. Patient advised they will receive an email to purchase the medication online and have it sent to their home. Patient provided with handout reviewing treatment course and side effects and advised to call or message [place] on MyChart with any concerns.  Reviewed course of treatment and expected reaction.  Patient advised to expect inflammation and crusting and advised that erosions are possible.  Patient  advised to be diligent with sun protection during and after  treatment. Counseled to keep medication out of reach of children and pets.   LENTIGINES, SEBORRHEIC KERATOSES, HEMANGIOMAS - Benign normal skin lesions - Benign-appearing - Call for any changes  MELANOCYTIC NEVI - Tan-brown and/or pink-flesh-colored symmetric macules and papules - Benign appearing on exam today - Observation - Call clinic for new or changing moles - Recommend daily use of broad spectrum spf 30+ sunscreen to sun-exposed areas.   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  - Call if any new or changing lesions are noted between office visits  HISTORY OF BASAL CELL CARCINOMA OF THE SKIN Multiple locations see history  - No evidence of recurrence today - Recommend regular full body skin exams - Recommend daily broad spectrum sunscreen SPF 30+ to sun-exposed areas, reapply every 2 hours as needed.  - Call if any new or changing lesions are noted between office visits  Actinic keratosis (26) right hand and arm x 20, left hand x 6  Actinic keratoses are precancerous spots that appear secondary to cumulative UV radiation exposure/sun exposure over time. They are chronic with expected duration over 1 year. A portion of actinic keratoses will progress to squamous cell carcinoma of the skin. It is not possible to reliably predict which spots will progress to skin cancer and so treatment is recommended to prevent development of skin cancer.  Recommend daily broad spectrum sunscreen SPF 30+ to sun-exposed areas, reapply every 2 hours as needed.  Recommend staying in the shade or wearing long sleeves, sun glasses (UVA+UVB protection) and wide brim hats (4-inch brim around the entire circumference of the hat). Call for new or changing  lesions.  Destruction of lesion - right hand and arm x 20, left hand x 6 (26) Complexity: simple   Destruction method: cryotherapy   Informed consent: discussed and consent obtained   Timeout:  patient name, date of birth, surgical site, and procedure verified Lesion destroyed using liquid nitrogen: Yes   Region frozen until ice ball extended beyond lesion: Yes   Outcome: patient tolerated procedure well with no complications   Post-procedure details: wound care instructions given    Inflamed seborrheic keratosis (7) b/l legs x 7  Symptomatic, irritating, patient would like treated.  Destruction of lesion - b/l legs x 7 (7) Complexity: simple   Destruction method: cryotherapy   Informed consent: discussed and consent obtained   Timeout:  patient name, date of birth, surgical site, and procedure verified Lesion destroyed using liquid nitrogen: Yes   Region frozen until ice ball extended beyond lesion: Yes   Outcome: patient tolerated procedure well with no complications   Post-procedure details: wound care instructions given     Return for 6 month ak follow up, 1 yr tbse .  IAsher Muir, CMA, am acting as scribe for Armida Sans, MD.   Documentation: I have reviewed the above documentation for accuracy and completeness, and I agree with the above.  Armida Sans, MD

## 2023-02-08 NOTE — Patient Instructions (Addendum)
Actinic keratoses are precancerous spots that appear secondary to cumulative UV radiation exposure/sun exposure over time. They are chronic with expected duration over 1 year. A portion of actinic keratoses will progress to squamous cell carcinoma of the skin. It is not possible to reliably predict which spots will progress to skin cancer and so treatment is recommended to prevent development of skin cancer.  Recommend daily broad spectrum sunscreen SPF 30+ to sun-exposed areas, reapply every 2 hours as needed.  Recommend staying in the shade or wearing long sleeves, sun glasses (UVA+UVB protection) and wide brim hats (4-inch brim around the entire circumference of the hat). Call for new or changing lesions.    Cryotherapy Aftercare  Wash gently with soap and water everyday.   Apply Vaseline and Band-Aid daily until healed.    Restart cream in 1 month  - Start 5-fluorouracil/calcipotriene cream twice a day for 10 days to affected areas including top of hands.  Instructions for [facility]  One or more of your medications was sent to the [facility]. You will receive an email from them and can purchase the medicine through that link. It will then be mailed to your home at the address you confirmed. If for any reason you do not receive an email from them, please check your spam folder. If you still do not find the email, please let us know. [facility] phone number is [phone].     5-Fluorouracil/Calcipotriene Patient Education   Actinic keratoses are the dry, red scaly spots on the skin caused by sun damage. A portion of these spots can turn into skin cancer with time, and treating them can help prevent development of skin cancer.   Treatment of these spots requires removal of the defective skin cells. There are various ways to remove actinic keratoses, including freezing with liquid nitrogen, treatment with creams, or treatment  with a blue light procedure in the office.   5-fluorouracil cream is a topical cream used to treat actinic keratoses. It works by interfering with the growth of abnormal fast-growing skin cells, such as actinic keratoses. These cells peel off and are replaced by healthy ones.   5-fluorouracil/calcipotriene is a combination of the 5-fluorouracil cream with a vitamin D analog cream called calcipotriene. The calcipotriene alone does not treat actinic keratoses. However, when it is combined with 5-fluorouracil, it helps the 5-fluorouracil treat the actinic keratoses much faster so that the same results can be achieved with a much shorter treatment time.  INSTRUCTIONS FOR 5-FLUOROURACIL/CALCIPOTRIENE CREAM:   5-fluorouracil/calcipotriene cream typically only needs to be used for 4-7 days. A thin layer should be applied twice a day to the treatment areas recommended by your physician.   If your physician prescribed you separate tubes of 5-fluourouracil and calcipotriene, apply a thin layer of 5-fluorouracil followed by a thin layer of calcipotriene.   Avoid contact with your eyes, nostrils, and mouth. Do not use 5-fluorouracil/calcipotriene cream on infected or open wounds.   You will develop redness, irritation and some crusting at areas where you have pre-cancer damage/actinic keratoses. IF YOU DEVELOP PAIN, BLEEDING, OR SIGNIFICANT CRUSTING, STOP THE TREATMENT EARLY - you have already gotten a good response and the actinic keratoses should clear up well.  Wash your hands after applying 5-fluorouracil 5% cream on your skin.   A moisturizer or sunscreen with a minimum SPF 30 should be applied each morning.   Once you have finished the treatment, you can apply a thin layer of  Vaseline twice a day to irritated areas to soothe and calm the areas more quickly. If you experience significant discomfort, contact your physician.  For some patients it is necessary to repeat the treatment for best  results.  SIDE EFFECTS: When using 5-fluorouracil/calcipotriene cream, you may have mild irritation, such as redness, dryness, swelling, or a mild burning sensation. This usually resolves within 2 weeks. The more actinic keratoses you have, the more redness and inflammation you can expect during treatment. Eye irritation has been reported rarely. If this occurs, please let us know.  If you have any trouble using this cream, please call the office. If you have any other questions about this information, please do not hesitate to ask me before you leave the office.    Melanoma ABCDEs  Melanoma is the most dangerous type of skin cancer, and is the leading cause of death from skin disease.  You are more likely to develop melanoma if you: Have light-colored skin, light-colored eyes, or red or blond hair Spend a lot of time in the sun Tan regularly, either outdoors or in a tanning bed Have had blistering sunburns, especially during childhood Have a close family member who has had a melanoma Have atypical moles or large birthmarks  Early detection of melanoma is key since treatment is typically straightforward and cure rates are extremely high if we catch it early.   The first sign of melanoma is often a change in a mole or a new dark spot.  The ABCDE system is a way of remembering the signs of melanoma.  A for asymmetry:  The two halves do not match. B for border:  The edges of the growth are irregular. C for color:  A mixture of colors are present instead of an even brown color. D for diameter:  Melanomas are usually (but not always) greater than 6mm - the size of a pencil eraser. E for evolution:  The spot keeps changing in size, shape, and color.  Please check your skin once per month between visits. You can use a small mirror in front and a large mirror behind you to keep an eye on the back side or your body.   If you see any new or changing lesions before your next follow-up, please call  to schedule a visit.  Please continue daily skin protection including broad spectrum sunscreen SPF 30+ to sun-exposed areas, reapplying every 2 hours as needed when you're outdoors.   Staying in the shade or wearing long sleeves, sun glasses (UVA+UVB protection) and wide brim hats (4-inch brim around the entire circumference of the hat) are also recommended for sun protection.    Due to recent changes in healthcare laws, you may see results of your pathology and/or laboratory studies on MyChart before the doctors have had a chance to review them. We understand that in some cases there may be results that are confusing or concerning to you. Please understand that not all results are received at the same time and often the doctors may need to interpret multiple results in order to provide you with the best plan of care or course of treatment. Therefore, we ask that you please give [place] 2 business days to thoroughly review all your results before contacting the office for clarification. Should we see a critical lab result, you will be contacted sooner.   If You Need Anything After Your Visit  If you have any questions or concerns for your doctor, please call our main line at  [phone] and press option 4 to reach your doctor's medical assistant. If no one answers, please leave a voicemail as directed and we will return your call as soon as possible. Messages left after 4 pm will be answered the following business day.   You may also send [place] a message via MyChart. We typically respond to MyChart messages within 1-2 business days.  For prescription refills, please ask your pharmacy to contact our office. Our fax number is [phone].  If you have an urgent issue when the clinic is closed that cannot wait until the next business day, you can page your doctor at the number below.    Please note that while we do our best to be available for urgent issues outside of office hours, we are not available  24/7.   If you have an urgent issue and are unable to reach [place], you may choose to seek medical care at your doctor's office, retail clinic, urgent care center, or emergency room.  If you have a medical emergency, please immediately call 911 or go to the emergency department.  Pager Numbers  - Dr. Gwen Pounds: [phone]  - Dr. Roseanne Reno: [phone]  - Dr. Katrinka Blazing: [phone]   In the event of inclement weather, please call our main line at [phone] for an update on the status of any delays or closures.  Dermatology Medication Tips: Please keep the boxes that topical medications come in in order to help keep track of the instructions about where and how to use these. Pharmacies typically print the medication instructions only on the boxes and not directly on the medication tubes.   If your medication is too expensive, please contact our office at [phone] option 4 or send [place] a message through MyChart.   We are unable to tell what your co-pay for medications will be in advance as this is different depending on your insurance coverage. However, we may be able to find a substitute medication at lower cost or fill out paperwork to get insurance to cover a needed medication.   If a prior authorization is required to get your medication covered by your insurance company, please allow [place] 1-2 business days to complete this process.  Drug prices often vary depending on where the prescription is filled and some pharmacies may offer cheaper prices.  The website www.goodrx.com contains coupons for medications through different pharmacies. The prices here do not account for what the cost may be with help from insurance (it may be cheaper with your insurance), but the website can give you the price if you did not use any insurance.  - You can print the associated coupon and take it with your prescription to the pharmacy.  - You may also stop by our office during regular business hours and pick  up a GoodRx coupon card.  - If you need your prescription sent electronically to a different pharmacy, notify our office through [facility] or by phone at [phone] option 4.     Si Usted Necesita Algo Despus de Su Visita  Tambin puede enviarnos un mensaje a travs de Clinical cytogeneticist. Por lo general respondemos a los mensajes de MyChart en el transcurso de 1 a 2 das hbiles.  Para renovar recetas, por favor pida a su farmacia que se ponga en contacto con nuestra oficina. Annie Sable de fax es [place] [phone].  Si tiene un asunto urgente cuando la clnica est cerrada y que no puede esperar hasta el siguiente da hbil, Orthoptist a su  doctor(a) al nmero que aparece a continuacin.   Por favor, tenga en cuenta que aunque hacemos todo lo posible para estar disponibles para asuntos urgentes fuera del horario de [place], no estamos disponibles las 24 horas del da, los 7 [address]  [address] de la [place].   Si tiene un problema urgente y no puede comunicarse con nosotros, puede optar por buscar atencin mdica  en el consultorio de su doctor(a), en una clnica privada, en un centro de atencin urgente o en una sala de emergencias.  Si tiene Engineer, drilling, por favor llame inmediatamente al 911 o vaya a la sala de emergencias.  Nmeros de bper  - Dr. Gwen Pounds: [phone]  - Dra. Roseanne Reno: [phone]  - Dr. Katrinka Blazing: [phone]   En caso de inclemencias del tiempo, por favor llame a Lacy Duverney principal al [phone] para una actualizacin sobre el [place] de cualquier retraso o cierre.  Consejos para la medicacin en dermatologa: Por favor, guarde las cajas en las que vienen los medicamentos de uso tpico para ayudarle a seguir las instrucciones sobre dnde y cmo usarlos. Las farmacias generalmente imprimen las instrucciones del medicamento slo en las cajas y no directamente en los tubos del [place].   Si su medicamento es muy caro, por favor, pngase en  contacto con Rolm Gala llamando al [phone] y presione la opcin 4 o envenos un mensaje a travs de Clinical cytogeneticist.   No podemos decirle cul ser su copago por los medicamentos por adelantado ya que esto es diferente dependiendo de la cobertura de su seguro. Sin embargo, es posible que podamos encontrar un medicamento sustituto a Audiological scientist un formulario para que el seguro cubra el medicamento que se considera necesario.   Si se requiere una autorizacin previa para que su compaa de seguros [place] su medicamento, por favor permtanos de 1 a 2 das hbiles para completar [address]  Los precios de los medicamentos varan con frecuencia dependiendo del Environmental consultant de dnde se surte la receta y alguna farmacias pueden ofrecer precios ms baratos.  El sitio web www.goodrx.com tiene cupones para medicamentos de Health and safety inspector. Los precios aqu no tienen en cuenta lo que podra costar con la ayuda del seguro (puede ser ms barato con su seguro), pero el sitio web puede darle el precio si no utiliz Tourist information centre manager.  - Puede imprimir el cupn correspondiente y llevarlo con su receta a la farmacia.  - Tambin puede pasar por nuestra oficina durante el horario de atencin regular y Education officer, museum una tarjeta de cupones de GoodRx.  - Si necesita que su receta se enve electrnicamente a una farmacia diferente, informe a nuestra oficina a travs de MyChart de [place] o por telfono llamando al [phone] y presione la opcin 4.

## 2023-02-09 ENCOUNTER — Encounter: Payer: Self-pay | Admitting: Dermatology

## 2023-04-24 ENCOUNTER — Ambulatory Visit: Payer: Self-pay | Admitting: Surgery

## 2023-04-24 NOTE — H&P (Signed)
Subjective    Chief Complaint: New Consultation (Umbilical hernia)       History of Present Illness: Benjamin Little is a 57 y.o. male who is seen today as an office consultation at the request of Dr. Larwance Sachs for evaluation of New Consultation (Umbilical hernia) .     This is a 57 year old male with diabetes and sleep apnea who presents with at least 8 years of a slowly enlarging umbilical hernia.  No previous surgery in this area.  No obstructive symptoms.  This is beginning to cause more discomfort.  The skin overlying this area is mildly discolored.  He presents now for surgical evaluation for repair.     Review of Systems: A complete review of systems was obtained from the patient.  I have reviewed this information and discussed as appropriate with the patient.  See HPI as well for other ROS.   Review of Systems  Constitutional: Negative.   HENT: Negative.    Eyes: Negative.   Respiratory: Negative.    Cardiovascular: Negative.   Gastrointestinal: Negative.   Genitourinary: Negative.   Musculoskeletal: Negative.   Skin: Negative.   Neurological: Negative.   Endo/Heme/Allergies: Negative.   Psychiatric/Behavioral: Negative.          Medical History: Past Medical History      Past Medical History:  Diagnosis Date   Alopecia     Anxiety state 11/22/2016   Anxiety state, unspecified     B12 deficiency 08/27/2015   Benign essential hypertension 11/22/2016   Diabetes mellitus without complication (CMS/HHS-HCC) 05/06/2014   Esophageal reflux     Essential hypertension, benign     Fatty liver      With normal liver enzymes.   History of colonic polyps     History of esophageal stricture      With dysphagia in 1988, 1991 and 1994.   Internal hemorrhoids     OSA on CPAP 05/06/2014    He has brought his compliance report that shows he is 98% compliant of greater than 4 hours every night.   Osteopenia 11/22/2016    Last Assessment & Plan:  We will need to look into  this further at follow-up.  He does have a pathologic fracture may be a candidate for osteoclast inhibitors including Prolia once he is over the acute healing aspect.   Other and unspecified hyperlipidemia     Umbilical hernia without obstruction and without gangrene 05/07/2015        Problem List     Patient Active Problem List  Diagnosis   Esophageal reflux   Anxiety state   Benign essential hypertension   Other and unspecified hyperlipidemia   Alopecia   Diabetes mellitus without complication (CMS/HHS-HCC)   OSA on CPAP   Umbilical hernia without obstruction and without gangrene   B12 deficiency   Distal radius fracture, right   Osteopenia   Personal history of other malignant neoplasm of skin        Past Surgical History       Past Surgical History:  Procedure Laterality Date   COLONOSCOPY   07/30/2018    PH Adenomatous Polyp; Hyperplastic/Inflammatory Polyp: CBF 07/2023   EGD   07/30/2018    Reflux Esophagitis: No repeat per [facility]   TONSILLECTOMY            Allergies      Allergies  Allergen Reactions   Polysporin [Bacitracin-Polymyxin B] Rash        Medications Ordered Prior to [employer]  Current Outpatient Medications on File Prior to Visit  Medication Sig Dispense Refill   cholecalciferol (VITAMIN D3) 2,000 unit capsule Take 2,000 Units by mouth once daily       famotidine (PEPCID) 40 MG tablet take 1 tablet by mouth every day at night 90 tablet 1   glipiZIDE (GLUCOTROL XL) 10 MG XL tablet TAKE 1 TABLET BY MOUTH EVERY DAY 90 tablet 1   Herbal Supplement Herbal Name: Shaklee B Complex - daily       JARDIANCE 25 mg tablet TAKE 1 TABLET BY MOUTH EVERY DAY 90 tablet 3   lisinopriL-hydroCHLOROthiazide (ZESTORETIC) 20-12.5 mg tablet TAKE 2 TABLETS BY MOUTH EVERY DAY 180 tablet 3   metFORMIN (GLUCOPHAGE) 500 MG tablet take 2 tablets by mouth twice a day with food 360 tablet 1   pantoprazole (PROTONIX) 40 MG DR tablet TAKE 1 TABLET BY MOUTH EVERY DAY 90  tablet 3   simvastatin (ZOCOR) 20 MG tablet TAKE 1 TABLET BY MOUTH EVERY DAY EVERY NIGHT 90 tablet 3   TURMERIC ROOT EXTRACT ORAL Take 1,650 mg by mouth 2 (two) times daily       cetirizine (ZYRTEC) 10 MG tablet Take 10 mg by mouth once daily (Patient not taking: Reported on 04/24/2023)       cholecalciferol, vitD3,/vit K2 (VITAMIN D3-VITAMIN K2) 125-90 mcg Cap Take 1 capsule by mouth once daily (Patient not taking: Reported on 04/24/2023)       finasteride (PROPECIA) 1 mg tablet TAKE 1 TABLET BY MOUTH ONCE A DAY *NOT COVERED BY INS* (Patient not taking: Reported on 04/24/2023) 90 tablet 3   GINGER ROOT EXTRACT ORAL Take 140 mg by mouth once daily (Patient not taking: Reported on 04/24/2023)       lancets (ONETOUCH DELICA PLUS LANCET) USE 1 EACH ONCE DAILY USE AS INSTRUCTED. (Patient not taking: Reported on 04/24/2023) 100 each 3   lancing device with lancets kit Use 1 each once daily Use as instructed. 100 each 3   ONETOUCH VERIO TEST STRIPS test strip 1 EACH (1 STRIP TOTAL) BY XX ROUTE ONCE DAILY USE AS INSTRUCTED. (Patient not taking: Reported on 04/24/2023) 100 strip 3   Saccharomyces boulardii (FLORASTOR) 250 mg capsule Take 250 mg by mouth once daily (Patient not taking: Reported on 04/24/2023)        No current facility-administered medications on file prior to visit.        Family History       Family History  Problem Relation Age of Onset   Coronary Artery Disease (Blocked arteries around heart) Mother     Hyperlipidemia (Elevated cholesterol) Mother     High blood pressure (Hypertension) Mother     Coronary Artery Disease (Blocked arteries around heart) Father     Obesity Father     Hyperlipidemia (Elevated cholesterol) Father     High blood pressure (Hypertension) Father     Diabetes type II Father     Heart disease Father          Tobacco Use History  Social History       Tobacco Use  Smoking Status Never  Smokeless Tobacco Never        Social History  Social  History        Socioeconomic History   Marital status: Married  Tobacco Use   Smoking status: Never   Smokeless tobacco: Never  Vaping Use   Vaping status: Never Used  Substance and Sexual Activity   Alcohol use: Not Currently  Drug use: No    Social Drivers of Acupuncturist Strain: Patient Declined (11/17/2022)    Overall Financial Resource Strain (CARDIA)     Difficulty of Paying Living Expenses: Patient declined  Food Insecurity: Patient Declined (11/17/2022)    Hunger Vital Sign     Worried About Running Out of Food in the Last Year: Patient declined     Ran Out of Food in the Last Year: Patient declined  Transportation Needs: Patient Declined (11/17/2022)    PRAPARE - Therapist, art (Medical): Patient declined     Lack of Transportation (Non-Medical): Patient declined  Housing Stability: Patient Declined (11/17/2022)    Housing Stability Vital Sign     Unable to Pay for Housing in the Last Year: Patient declined     Homeless in the Last Year: Patient declined        Objective:         Vitals:    04/24/23 1049  BP: 128/84  Pulse: 81  Temp: 36.7 C (98 F)  SpO2: 98%  Weight: 96.4 kg (212 lb 9.6 oz)  Height: 185.4 cm (6\' 1" )  PainSc: 0-No pain    Body mass index is 28.05 kg/m.   Physical Exam    Constitutional:  WDWN in NAD, conversant, no obvious deformities; lying in bed comfortably Eyes:  Pupils equal, round; sclera anicteric; moist conjunctiva; no lid lag HENT:  Oral mucosa moist; good dentition  Neck:  No masses palpated, trachea midline; no thyromegaly Lungs:  CTA bilaterally; normal respiratory effort CV:  Regular rate and rhythm; no murmurs; extremities well-perfused with no edema Abd:  +bowel sounds, soft, non-tender, no palpable organomegaly; protruding umbilical hernia that is easily reducible.  The palpable fascial defect measures 3 cm in diameter. Musc: Normal gait; no apparent clubbing or cyanosis  in extremities Lymphatic:  No palpable cervical or axillary lymphadenopathy Skin:  Warm, dry; no sign of jaundice Psychiatric - alert and oriented x 4; calm mood and affect     Assessment and Plan:  Diagnoses and all orders for this visit:   Umbilical hernia without obstruction or gangrene       Recommend umbilical hernia repair with mesh.The surgical procedure has been discussed with the patient.  Potential risks, benefits, alternative treatments, and expected outcomes have been explained.  All of the patient's questions at this time have been answered.  The likelihood of reaching the patient's treatment goal is good.  The patient understands the proposed surgical procedure and wishes to proceed.     Hendrix Console Delbert Harness, MD  04/24/2023 11:18 AM

## 2023-08-16 ENCOUNTER — Ambulatory Visit: Payer: 59 | Admitting: Dermatology

## 2023-08-16 ENCOUNTER — Encounter: Payer: Self-pay | Admitting: Dermatology

## 2023-08-16 DIAGNOSIS — W908XXA Exposure to other nonionizing radiation, initial encounter: Secondary | ICD-10-CM | POA: Diagnosis not present

## 2023-08-16 DIAGNOSIS — L578 Other skin changes due to chronic exposure to nonionizing radiation: Secondary | ICD-10-CM | POA: Diagnosis not present

## 2023-08-16 DIAGNOSIS — Z5111 Encounter for antineoplastic chemotherapy: Secondary | ICD-10-CM

## 2023-08-16 DIAGNOSIS — L570 Actinic keratosis: Secondary | ICD-10-CM | POA: Diagnosis not present

## 2023-08-16 DIAGNOSIS — Z7189 Other specified counseling: Secondary | ICD-10-CM

## 2023-08-16 DIAGNOSIS — Z85828 Personal history of other malignant neoplasm of skin: Secondary | ICD-10-CM

## 2023-08-16 DIAGNOSIS — Z79899 Other long term (current) drug therapy: Secondary | ICD-10-CM

## 2023-08-16 NOTE — Progress Notes (Signed)
 Follow-Up Visit   Subjective  Benjamin Little is a 58 y.o. male who presents for the following: AK 75m f/u, pt used 5FU/Calcipotriene to hands with some improvement ~03/2023, check scaly spot R preauricular The patient has spots, moles and lesions to be evaluated, some may be new or changing and the patient may have concern these could be cancer.  The following portions of the chart were reviewed this encounter and updated as appropriate: medications, allergies, medical history  Review of Systems:  No other skin or systemic complaints except as noted in HPI or Assessment and Plan.  Objective  Well appearing patient in no apparent distress; mood and affect are within normal limits.  A focused examination was performed of the following areas: Face, hands, arms  Relevant exam findings are noted in the Assessment and Plan.  face, ears x 7, hands, arms >15 (22) Pink scaly macules  Assessment & Plan   AK (ACTINIC KERATOSIS) (22) face, ears x 7, hands, arms >15 (22) Actinic keratoses are precancerous spots that appear secondary to cumulative UV radiation exposure/sun exposure over time. They are chronic with expected duration over 1 year. A portion of actinic keratoses will progress to squamous cell carcinoma of the skin. It is not possible to reliably predict which spots will progress to skin cancer and so treatment is recommended to prevent development of skin cancer.  Recommend daily broad spectrum sunscreen SPF 30+ to sun-exposed areas, reapply every 2 hours as needed.  Recommend staying in the shade or wearing long sleeves, sun glasses (UVA+UVB protection) and wide brim hats (4-inch brim around the entire circumference of the hat). Call for new or changing lesions. Destruction of lesion - face, ears x 7, hands, arms >15 (22) Complexity: simple   Destruction method: cryotherapy   Informed consent: discussed and consent obtained   Timeout:  patient name, date of birth, surgical  site, and procedure verified Lesion destroyed using liquid nitrogen: Yes   Region frozen until ice ball extended beyond lesion: Yes   Outcome: patient tolerated procedure well with no complications   Post-procedure details: wound care instructions given   Related Medications fluorouracil (EFUDEX) 5 % cream Apply to arms and hands twice a day  ACTINIC DAMAGE WITH PRECANCEROUS ACTINIC KERATOSES Counseling for Topical Chemotherapy Management: Patient exhibits: - Severe, confluent actinic changes with pre-cancerous actinic keratoses that is secondary to cumulative UV radiation exposure over time - Condition that is severe; chronic, not at goal. - diffuse scaly erythematous macules and papules with underlying dyspigmentation - Discussed Prescription "Field Treatment" topical Chemotherapy for Severe, Chronic Confluent Actinic Changes with Pre-Cancerous Actinic Keratoses Field treatment involves treatment of an entire area of skin that has confluent Actinic Changes (Sun/ Ultraviolet light damage) and PreCancerous Actinic Keratoses by method of PhotoDynamic Therapy (PDT) and/or prescription Topical Chemotherapy agents such as 5-fluorouracil, 5-fluorouracil/calcipotriene, and/or imiquimod.  The purpose is to decrease the number of clinically evident and subclinical PreCancerous lesions to prevent progression to development of skin cancer by chemically destroying early precancer changes that may or may not be visible.  It has been shown to reduce the risk of developing skin cancer in the treated area. As a result of treatment, redness, scaling, crusting, and open sores may occur during treatment course. One or more than one of these methods may be used and may have to be used several times to control, suppress and eliminate the PreCancerous changes. Discussed treatment course, expected reaction, and possible side effects. - Recommend daily broad spectrum  sunscreen SPF 30+ to sun-exposed areas, reapply every 2  hours as needed.  - Staying in the shade or wearing long sleeves, sun glasses (UVA+UVB protection) and wide brim hats (4-inch brim around the entire circumference of the hat) are also recommended. - Call for new or changing lesions.  - In 6-8 weeks Recommend Blue light with debridement to hands and possibly arms  Reviewed course of treatment and expected reaction.  Patient advised to expect inflammation and crusting and advised that erosions are possible.  Patient advised to be diligent with sun protection during and after treatment. Counseled to keep medication out of reach of children and pets.   HISTORY OF BASAL CELL CARCINOMA OF THE SKIN multiple - No evidence of recurrence today - Recommend regular full body skin exams - Recommend daily broad spectrum sunscreen SPF 30+ to sun-exposed areas, reapply every 2 hours as needed.  - Call if any new or changing lesions are noted between office visits  Return for 6-8 wks Blue Light with debridement to hands and possibly arms, as scheduled for TBSE,.  I, Sonya Hupman, RMA, am acting as scribe for Armida Sans, MD .   Documentation: I have reviewed the above documentation for accuracy and completeness, and I agree with the above.  Armida Sans, MD

## 2023-08-16 NOTE — Patient Instructions (Addendum)
 Cryotherapy Aftercare  Wash gently with soap and water everyday.   Apply Vaseline and Band-Aid daily until healed.    Photodynamic Therapy- "Blue or Red Light Therapy"  Actinic keratoses are the dry, red scaly spots on the skin caused by sun damage. A portion of these spots can turn into skin cancer with time, and treating them can help prevent development of skin cancer.   Treatment of these spots requires removal of the defective skin cells. There are various ways to remove actinic keratoses, including freezing with liquid nitrogen, treatment with creams, or treatment with a blue light procedure in the office.   Photodynamic Therapy (PDT), also known as "blue or red light therapy" is an in office procedure used to treat actinic keratoses. It works by targeting precancerous cells. After treatment, these cells peel off and are replaced by healthy ones.   For your phototherapy appointment, you will have two appointments on the day of your treatment. The first appointment will be to apply a cream to the treatment area. You will leave this cream on for 1-2 hours depending on the area being treated. The second appointment will be to shine a blue or red light on the area for 16-20 minutes to kill off the precancer cells. It is common to experience a burning sensation during the treatment.  After your treatment, it will be important to keep the treated areas of skin out of the sun completely for 48-72 hours (2-3 days) to prevent having a reaction.   Common side effects include: - Burning or stinging, which may be severe and can last up to 24-72 hours after your treatment - Scaling and crusting which may last up to 2 weeks - Redness, swelling and/or peeling which can last up to 4 weeks  To Care for Your Skin After PDT/Blue/Red Light Therapy: - Wash with soap, water and shampoo as normal. - If needed, you can use cold compresses (e.g. ice packs) for comfort - If okay with your primary care doctor,  you may use analgesics such as acetaminophen (tylenol) every 4-6 hours, not to exceed recommended dose - You may apply Cerave Healing Ointment, Vaseline or Aquaphor as needed - If you have a lot of swelling you may take a Benadryl to help with this (this may cause drowsiness), not to exceed recommended dose. This may increase the risk of falls in people over 65 and may slow reaction time while driving, so it is not recommended to take before driving or operating machinery. - Sun Precautions - Wear a wide brim hat for the next week if outside  - Wear a sunblock with zinc or titanium dioxide at least SPF 50 daily  If you have any questions or concerns, please call the office and ask to speak with a nurse.   --------------------------------------------------------------------------------------------------------------    Due to recent changes in healthcare laws, you may see results of your pathology and/or laboratory studies on MyChart before the doctors have had a chance to review them. We understand that in some cases there may be results that are confusing or concerning to you. Please understand that not all results are received at the same time and often the doctors may need to interpret multiple results in order to provide you with the best plan of care or course of treatment. Therefore, we ask that you please give [place] 2 business days to thoroughly review all your results before contacting the office for clarification. Should we see a critical lab result, you will be  contacted sooner.   If You Need Anything After Your Visit  If you have any questions or concerns for your doctor, please call our main line at [phone] and press option 4 to reach your doctor's medical assistant. If no one answers, please leave a voicemail as directed and we will return your call as soon as possible. Messages left after 4 pm will be answered the following business day.   You may also send [place] a message via MyChart.  We typically respond to MyChart messages within 1-2 business days.  For prescription refills, please ask your pharmacy to contact our office. Our fax number is [phone].  If you have an urgent issue when the clinic is closed that cannot wait until the next business day, you can page your doctor at the number below.    Please note that while we do our best to be available for urgent issues outside of office hours, we are not available 24/7.   If you have an urgent issue and are unable to reach [place], you may choose to seek medical care at your doctor's office, retail clinic, urgent care center, or emergency room.  If you have a medical emergency, please immediately call 911 or go to the emergency department.  Pager Numbers  - Dr. Gwen Pounds: [phone]  - Dr. Roseanne Reno: [phone]  - Dr. Katrinka Blazing: [phone]   In the event of inclement weather, please call our main line at [phone] for an update on the status of any delays or closures.  Dermatology Medication Tips: Please keep the boxes that topical medications come in in order to help keep track of the instructions about where and how to use these. Pharmacies typically print the medication instructions only on the boxes and not directly on the medication tubes.   If your medication is too expensive, please contact our office at [phone] option 4 or send [place] a message through MyChart.   We are unable to tell what your co-pay for medications will be in advance as this is different depending on your insurance coverage. However, we may be able to find a substitute medication at lower cost or fill out paperwork to get insurance to cover a needed medication.   If a prior authorization is required to get your medication covered by your insurance company, please allow [place] 1-2 business days to complete this process.  Drug prices often vary depending on where the prescription is filled and some pharmacies may offer cheaper prices.  The  website www.goodrx.com contains coupons for medications through different pharmacies. The prices here do not account for what the cost may be with help from insurance (it may be cheaper with your insurance), but the website can give you the price if you did not use any insurance.  - You can print the associated coupon and take it with your prescription to the pharmacy.  - You may also stop by our office during regular business hours and pick up a GoodRx coupon card.  - If you need your prescription sent electronically to a different pharmacy, notify our office through [facility] or by phone at [phone] option 4.     Si Usted Necesita Algo Despus de Su Visita  Tambin puede enviarnos un mensaje a travs de Clinical cytogeneticist. Por lo general respondemos a los mensajes de MyChart en el transcurso de 1 a 2 das hbiles.  Para renovar recetas, por favor pida a su farmacia que se ponga en contacto con nuestra oficina. Annie Sable de fax  es el [phone].  Si tiene un asunto urgente cuando la clnica est cerrada y que no puede esperar hasta el siguiente da hbil, puede llamar/localizar a su doctor(a) al nmero que aparece a continuacin.   Por favor, tenga en cuenta que aunque hacemos todo lo posible para estar disponibles para asuntos urgentes fuera del horario de [place], no estamos disponibles las 24 horas del da, los 7 [address]  [address] de la [place].   Si tiene un problema urgente y no puede comunicarse con nosotros, puede optar por buscar atencin mdica  en el consultorio de su doctor(a), en una clnica privada, en un centro de atencin urgente o en una sala de emergencias.  Si tiene Engineer, drilling, por favor llame inmediatamente al 911 o vaya a la sala de emergencias.  Nmeros de bper  - Dr. Gwen Pounds: [phone]  - Dra. Roseanne Reno: [phone]  - Dr. Katrinka Blazing: [phone]   En caso de inclemencias del tiempo, por favor llame a Lacy Duverney principal al [phone] para una  actualizacin sobre el [place] de cualquier retraso o cierre.  Consejos para la medicacin en dermatologa: Por favor, guarde las cajas en las que vienen los medicamentos de uso tpico para ayudarle a seguir las instrucciones sobre dnde y cmo usarlos. Las farmacias generalmente imprimen las instrucciones del medicamento slo en las cajas y no directamente en los tubos del [place].   Si su medicamento es muy caro, por favor, pngase en contacto con Rolm Gala llamando al [phone] y presione la opcin 4 o envenos un mensaje a travs de Clinical cytogeneticist.   No podemos decirle cul ser su copago por los medicamentos por adelantado ya que esto es diferente dependiendo de la cobertura de su seguro. Sin embargo, es posible que podamos encontrar un medicamento sustituto a Audiological scientist un formulario para que el seguro cubra el medicamento que se considera necesario.   Si se requiere una autorizacin previa para que su compaa de seguros [place] su medicamento, por favor permtanos de 1 a 2 das hbiles para completar [address]  Los precios de los medicamentos varan con frecuencia dependiendo del Environmental consultant de dnde se surte la receta y alguna farmacias pueden ofrecer precios ms baratos.  El sitio web www.goodrx.com tiene cupones para medicamentos de Health and safety inspector. Los precios aqu no tienen en cuenta lo que podra costar con la ayuda del seguro (puede ser ms barato con su seguro), pero el sitio web puede darle el precio si no utiliz Tourist information centre manager.  - Puede imprimir el cupn correspondiente y llevarlo con su receta a la farmacia.  - Tambin puede pasar por nuestra oficina durante el horario de atencin regular y Education officer, museum una tarjeta de cupones de GoodRx.  - Si necesita que su receta se enve electrnicamente a una farmacia diferente, informe a nuestra oficina a travs de MyChart de [place] o por telfono llamando al [phone] y presione la opcin 4.

## 2023-09-27 ENCOUNTER — Ambulatory Visit

## 2023-09-27 ENCOUNTER — Encounter

## 2023-10-18 ENCOUNTER — Encounter

## 2023-10-18 ENCOUNTER — Ambulatory Visit: Admitting: Dermatology

## 2023-10-18 DIAGNOSIS — L570 Actinic keratosis: Secondary | ICD-10-CM

## 2023-10-18 MED ORDER — AMINOLEVULINIC ACID HCL 20 % EX SOLR
2.0000 | Freq: Once | CUTANEOUS | Status: AC
  Administered 2023-10-18: 708 mg via TOPICAL

## 2023-10-18 NOTE — Patient Instructions (Signed)
 Levulan/PDT Treatment Common Side Effects  - Burning/stinging, which may be severe and last up to 24-72 hours after your treatment  - Redness, swelling and/or peeling which may last up to 4 weeks  - Scaling/crusting which may last up to 2 weeks  - Sun sensitivity (you MUST avoid sun exposure for 48-72 hours after treatment)  Care Instructions  - Okay to wash with soap and water and shampoo as normal  - If needed, you can do a cold compress (ex. Ice packs) for comfort  - If okay with your Primary Doctor, you may use analgesics such as Tylenol every 4-6 hours, not to exceed recommended dose  - You may apply Cerave Healing Ointment, Vaseline or Aquaphor  - If you have a lot of swelling you may take a Benadryl to help with this (this may cause drowsiness)  Sun Precautions  - Wear a wide brim hat for the next week if outside  - Wear a sunblock with zinc or titanium dioxide at least SPF 50 daily

## 2023-10-18 NOTE — Progress Notes (Unsigned)
 Patient completed blue light phototherapy with debridement today.  ACTINIC KERATOSES Exam: Erythematous thin papules/macules with gritty scale.  Treatment Plan:  Blue Light Photodynamic therapy  Procedure discussed: discussed risks, benefits, side effects. and alternatives   Prep: site scrubbed/prepped with acetone   Debridement needed: Yes (performed by Physician with sand paper.  (CPT [id number])) Location:  hands and arms Number of lesions:  Multiple (> 15) Type of treatment:  Blue light Aminolevulinic Acid (see MAR for details): Levulan Aminolevulinic Acid comment:  [id number] Amount of Ameluz (mg):  2 sticks Incubation time (minutes):  120 Number of minutes under lamp:  16:40 Cooling:  Fan Outcome: patient tolerated procedure well with no complications   Post-procedure details: sunscreen applied and aftercare instructions given to patient     Lisbeth Rides, RMA  I personally debrided area prior to application of aminolevulinic acid, Dantavious Snowball, MD.   Documentation: I have reviewed the above documentation for accuracy and completeness, and I agree with the above.  Celine Collard, MD

## 2023-10-19 ENCOUNTER — Encounter: Payer: Self-pay | Admitting: Dermatology

## 2024-02-14 ENCOUNTER — Ambulatory Visit: Payer: 59 | Admitting: Dermatology

## 2024-05-08 ENCOUNTER — Ambulatory Visit: Payer: Self-pay

## 2024-05-08 DIAGNOSIS — Z09 Encounter for follow-up examination after completed treatment for conditions other than malignant neoplasm: Secondary | ICD-10-CM | POA: Diagnosis present

## 2024-05-08 DIAGNOSIS — K5140 Inflammatory polyps of colon without complications: Secondary | ICD-10-CM | POA: Diagnosis not present

## 2024-05-08 DIAGNOSIS — K317 Polyp of stomach and duodenum: Secondary | ICD-10-CM | POA: Diagnosis not present

## 2024-05-08 DIAGNOSIS — Z860101 Personal history of adenomatous and serrated colon polyps: Secondary | ICD-10-CM | POA: Diagnosis not present

## 2024-05-08 DIAGNOSIS — K222 Esophageal obstruction: Secondary | ICD-10-CM | POA: Diagnosis not present

## 2024-05-08 DIAGNOSIS — K641 Second degree hemorrhoids: Secondary | ICD-10-CM | POA: Diagnosis not present

## 2024-05-08 DIAGNOSIS — K621 Rectal polyp: Secondary | ICD-10-CM | POA: Diagnosis not present
